# Patient Record
Sex: Female | Born: 1955 | Race: White | Hispanic: No | Marital: Married | Smoking: Never smoker
Health system: Southern US, Community
[De-identification: ages and names within clinical notes are randomized; demographics above are authoritative.]

## PROBLEM LIST (undated history)

## (undated) DIAGNOSIS — E079 Disorder of thyroid, unspecified: Secondary | ICD-10-CM

## (undated) DIAGNOSIS — D649 Anemia, unspecified: Secondary | ICD-10-CM

## (undated) DIAGNOSIS — T7840XA Allergy, unspecified, initial encounter: Secondary | ICD-10-CM

## (undated) DIAGNOSIS — M1990 Unspecified osteoarthritis, unspecified site: Secondary | ICD-10-CM

## (undated) DIAGNOSIS — E785 Hyperlipidemia, unspecified: Secondary | ICD-10-CM

## (undated) DIAGNOSIS — I959 Hypotension, unspecified: Secondary | ICD-10-CM

## (undated) HISTORY — PX: COLONOSCOPY: SHX174

## (undated) HISTORY — PX: WISDOM TOOTH EXTRACTION: SHX21

## (undated) HISTORY — PX: MEDIAL PARTIAL KNEE REPLACEMENT: SHX5965

## (undated) HISTORY — DX: Allergy, unspecified, initial encounter: T78.40XA

## (undated) HISTORY — DX: Disorder of thyroid, unspecified: E07.9

## (undated) HISTORY — DX: Anemia, unspecified: D64.9

## (undated) HISTORY — DX: Hypotension, unspecified: I95.9

## (undated) HISTORY — PX: BREAST BIOPSY: SHX20

## (undated) HISTORY — PX: KNEE ARTHROSCOPY: SUR90

## (undated) HISTORY — PX: TONSILLECTOMY: SUR1361

## (undated) HISTORY — PX: JOINT REPLACEMENT: SHX530

## (undated) HISTORY — DX: Hyperlipidemia, unspecified: E78.5

## (undated) HISTORY — DX: Unspecified osteoarthritis, unspecified site: M19.90

## (undated) HISTORY — PX: KNEE SURGERY: SHX244

---

## 2020-10-05 DIAGNOSIS — Z1211 Encounter for screening for malignant neoplasm of colon: Secondary | ICD-10-CM | POA: Diagnosis not present

## 2020-10-05 DIAGNOSIS — Z23 Encounter for immunization: Secondary | ICD-10-CM | POA: Diagnosis not present

## 2020-10-05 DIAGNOSIS — E039 Hypothyroidism, unspecified: Secondary | ICD-10-CM | POA: Diagnosis not present

## 2020-10-05 DIAGNOSIS — E785 Hyperlipidemia, unspecified: Secondary | ICD-10-CM | POA: Diagnosis not present

## 2020-10-05 DIAGNOSIS — Z1231 Encounter for screening mammogram for malignant neoplasm of breast: Secondary | ICD-10-CM | POA: Diagnosis not present

## 2020-10-05 DIAGNOSIS — Z0000 Encounter for general adult medical examination without abnormal findings: Secondary | ICD-10-CM | POA: Diagnosis not present

## 2020-10-10 DIAGNOSIS — Z0000 Encounter for general adult medical examination without abnormal findings: Secondary | ICD-10-CM | POA: Diagnosis not present

## 2020-10-10 DIAGNOSIS — E039 Hypothyroidism, unspecified: Secondary | ICD-10-CM | POA: Diagnosis not present

## 2020-10-25 DIAGNOSIS — Z1231 Encounter for screening mammogram for malignant neoplasm of breast: Secondary | ICD-10-CM | POA: Diagnosis not present

## 2020-11-03 ENCOUNTER — Encounter: Payer: Self-pay | Admitting: Gastroenterology

## 2020-11-14 DIAGNOSIS — D2372 Other benign neoplasm of skin of left lower limb, including hip: Secondary | ICD-10-CM | POA: Diagnosis not present

## 2020-11-14 DIAGNOSIS — D2271 Melanocytic nevi of right lower limb, including hip: Secondary | ICD-10-CM | POA: Diagnosis not present

## 2020-11-14 DIAGNOSIS — D239 Other benign neoplasm of skin, unspecified: Secondary | ICD-10-CM | POA: Diagnosis not present

## 2020-11-14 DIAGNOSIS — L578 Other skin changes due to chronic exposure to nonionizing radiation: Secondary | ICD-10-CM | POA: Diagnosis not present

## 2020-12-06 ENCOUNTER — Other Ambulatory Visit: Payer: Self-pay

## 2020-12-06 ENCOUNTER — Ambulatory Visit (AMBULATORY_SURGERY_CENTER): Payer: Self-pay | Admitting: *Deleted

## 2020-12-06 VITALS — Ht 67.0 in | Wt 163.0 lb

## 2020-12-06 DIAGNOSIS — Z1211 Encounter for screening for malignant neoplasm of colon: Secondary | ICD-10-CM

## 2020-12-06 NOTE — Progress Notes (Signed)
No egg or soy allergy known to patient  No issues with past sedation with any surgeries or procedures No intubation problems in the past  No FH of Malignant Hyperthermia No diet pills per patient No home 02 use per patient  No blood thinners per patient  Pt states issues with constipation due to thyroid- she goes daily but has hard balls- eats a high fiber diet - will add a 2 day Miralax prep   No A fib or A flutter  EMMI video to pt or via Ruth 19 guidelines implemented in PV today with Pt and RN  Pt is fully vaccinated  for Covid   Due to the COVID-19 pandemic we are asking patients to follow certain guidelines.  Pt aware of COVID protocols and [facility] guidelines

## 2020-12-19 ENCOUNTER — Encounter: Payer: Self-pay | Admitting: Gastroenterology

## 2020-12-23 ENCOUNTER — Encounter: Payer: Self-pay | Admitting: Gastroenterology

## 2020-12-23 ENCOUNTER — Other Ambulatory Visit: Payer: Self-pay

## 2020-12-23 ENCOUNTER — Ambulatory Visit (AMBULATORY_SURGERY_CENTER): Payer: BC Managed Care – PPO | Admitting: Gastroenterology

## 2020-12-23 VITALS — BP 91/85 | HR 64 | Temp 97.0°F | Resp 20 | Ht 67.0 in | Wt 163.0 lb

## 2020-12-23 DIAGNOSIS — Z1211 Encounter for screening for malignant neoplasm of colon: Secondary | ICD-10-CM

## 2020-12-23 MED ORDER — SODIUM CHLORIDE 0.9 % IV SOLN: 500.0000 mL | INTRAVENOUS | Status: DC

## 2020-12-23 NOTE — Op Note (Signed)
[facility] Patient Name: Tanya Brooks Procedure Date: 12/23/2020 8:28 AM MRN: [id number] Endoscopist: Justice Britain , MD Age: 65 Referring MD:  Date of Birth: 04/10/1956 Gender: Female Account #: [id number] Procedure:                Colonoscopy Indications:              Screening for colorectal malignant neoplasm Medicines:                Monitored Anesthesia Care Procedure:                Pre-Anesthesia Assessment:                           - Prior to the procedure, a History and Physical                            was performed, and patient medications and                            allergies were reviewed. The patient's tolerance of                            previous anesthesia was also reviewed. The risks                            and benefits of the procedure and the sedation                            options and risks were discussed with the patient.                            All questions were answered, and informed consent                            was obtained. Prior Anticoagulants: The patient has                            taken no previous anticoagulant or antiplatelet                            agents. ASA Grade Assessment: II - A patient with                            mild systemic disease. After reviewing the risks                            and benefits, the patient was deemed in                            satisfactory condition to undergo the procedure.                           After obtaining informed consent, the colonoscope  was passed under direct vision. Throughout the                            procedure, the patient's blood pressure, pulse, and                            oxygen saturations were monitored continuously. The                            Olympus PFC-[id number] (#[id number]) Colonoscope was                            introduced through the anus and advanced to the 5                            cm into the  ileum. The colonoscopy was performed                            without difficulty. The patient tolerated the                            procedure. The quality of the bowel preparation was                            adequate. Scope In: 8:48:04 AM Scope Out: 9:05:56 AM Scope Withdrawal Time: 0 hours 13 minutes 3 seconds  Total Procedure Duration: 0 hours 17 minutes 52 seconds  Findings:                 The digital rectal exam findings include                            hemorrhoids. Pertinent negatives include no                            palpable rectal lesions.                           The terminal ileum and ileocecal valve appeared                            normal.                           A few small-mouthed diverticula were found in the                            recto-sigmoid colon and sigmoid colon.                           Normal mucosa was found in the entire colon                            otherwise.  Non-bleeding non-thrombosed external and internal                            hemorrhoids were found during retroflexion, during                            perianal exam and during digital exam. The                            hemorrhoids were Grade II (internal hemorrhoids                            that prolapse but reduce spontaneously). Complications:            No immediate complications. Estimated Blood Loss:     Estimated blood loss: none. Impression:               - Hemorrhoids found on digital rectal exam.                           - The examined portion of the ileum was normal.                           - Diverticulosis in the recto-sigmoid colon and in                            the sigmoid colon.                           - Normal mucosa in the entire examined colon                            otherwise.                           - Non-bleeding non-thrombosed external and internal                            hemorrhoids. Recommendation:            - The patient will be observed post-procedure,                            until all discharge criteria are met.                           - Discharge patient to home.                           - Patient has a contact number available for                            emergencies. The signs and symptoms of potential                            delayed complications were discussed with the  patient. Return to normal activities tomorrow.                            Written discharge instructions were provided to the                            patient.                           - High fiber diet.                           - Use FiberCon 1-2 tablets PO daily.                           - Continue present medications.                           - Repeat colonoscopy in 10 years for screening                            purposes.                           - The findings and recommendations were discussed                            with the patient.                           - The findings and recommendations were discussed                            with the patient's family. Justice Britain, MD 12/23/2020 9:14:22 AM

## 2020-12-23 NOTE — Patient Instructions (Signed)
Impression/Recommendations:  Hemorrhoid, Diverticulosis, and High fiber diet handouts given to patient.  Use FiberCon 1-2 tablets by mouth daily. Continue present medications.  Repeat colonoscopy in 10 years for screening purposes.  YOU HAD AN ENDOSCOPIC PROCEDURE TODAY AT [facility]:   Refer to the procedure report that was given to you for any specific questions about what was found during the examination.  If the procedure report does not answer your questions, please call your gastroenterologist to clarify.  If you requested that your care partner not be given the details of your procedure findings, then the procedure report has been included in a sealed envelope for you to review at your convenience later.  YOU SHOULD EXPECT: Some feelings of bloating in the abdomen. Passage of more gas than usual.  Walking can help get rid of the air that was put into your GI tract during the procedure and reduce the bloating. If you had a lower endoscopy (such as a colonoscopy or flexible sigmoidoscopy) you may notice spotting of blood in your stool or on the toilet paper. If you underwent a bowel prep for your procedure, you may not have a normal bowel movement for a few days.  Please Note:  You might notice some irritation and congestion in your nose or some drainage.  This is from the oxygen used during your procedure.  There is no need for concern and it should clear up in a day or so.  SYMPTOMS TO REPORT IMMEDIATELY:   Following lower endoscopy (colonoscopy or flexible sigmoidoscopy):  Excessive amounts of blood in the stool  Significant tenderness or worsening of abdominal pains  Swelling of the abdomen that is new, acute  Fever of 100F or higher  For urgent or emergent issues, a gastroenterologist can be reached at any hour by calling [phone]. Do not use MyChart messaging for urgent concerns.    DIET:  We do recommend a small meal at first, but then you may proceed  to your regular diet.  Drink plenty of fluids but you should avoid alcoholic beverages for 24 hours.  ACTIVITY:  You should plan to take it easy for the rest of today and you should NOT DRIVE or use heavy machinery until tomorrow (because of the sedation medicines used during the test).    FOLLOW UP: Our staff will call the number listed on your records 48-72 hours following your procedure to check on you and address any questions or concerns that you may have regarding the information given to you following your procedure. If we do not reach you, we will leave a message.  We will attempt to reach you two times.  During this call, we will ask if you have developed any symptoms of COVID 19. If you develop any symptoms (ie: fever, flu-like symptoms, shortness of breath, cough etc.) before then, please call [phone].  If you test positive for Covid 19 in the 2 weeks post procedure, please call and report this information to [place].    If any biopsies were taken you will be contacted by phone or by letter within the next 1-3 weeks.  Please call us at [phone] if you have not heard about the biopsies in 3 weeks.    SIGNATURES/CONFIDENTIALITY: You and/or your care partner have signed paperwork which will be entered into your electronic medical record.  These signatures attest to the fact that that the information above on your After Visit Summary has been reviewed and is understood.  Full  responsibility of the confidentiality of this discharge information lies with you and/or your care-partner.

## 2020-12-23 NOTE — Progress Notes (Signed)
Report given to PACU, vss 

## 2020-12-27 ENCOUNTER — Telehealth: Payer: Self-pay | Admitting: *Deleted

## 2020-12-27 NOTE — Telephone Encounter (Signed)
  Follow up Call-  Call back number 12/23/2020  Post procedure Call Back phone  # [phone]  Permission to leave phone message Yes     Patient questions:  Do you have a fever, pain , or abdominal swelling? No. Pain Score  0 *  Have you tolerated food without any problems? Yes.    Have you been able to return to your normal activities? Yes.    Do you have any questions about your discharge instructions: Diet   No. Medications  No. Follow up visit  No.  Do you have questions or concerns about your Care? No.  Actions: * If pain score is 4 or above: No action needed, pain <4.  1. Have you developed a fever since your procedure? no  2.   Have you had an respiratory symptoms (SOB or cough) since your procedure? no  3.   Have you tested positive for COVID 19 since your procedure no  4.   Have you had any family members/close contacts diagnosed with the COVID 19 since your procedure?  no   If yes to any of these questions please route to Joylene John, RN and Joella Prince, RN

## 2021-01-19 DIAGNOSIS — K5790 Diverticulosis of intestine, part unspecified, without perforation or abscess without bleeding: Secondary | ICD-10-CM | POA: Insufficient documentation

## 2021-05-26 ENCOUNTER — Ambulatory Visit (INDEPENDENT_AMBULATORY_CARE_PROVIDER_SITE_OTHER): Payer: BC Managed Care – PPO | Admitting: Family Medicine

## 2021-05-26 ENCOUNTER — Other Ambulatory Visit: Payer: Self-pay

## 2021-05-26 ENCOUNTER — Encounter: Payer: Self-pay | Admitting: Family Medicine

## 2021-05-26 VITALS — BP 100/78 | HR 65 | Temp 97.7°F | Ht 67.0 in | Wt 157.2 lb

## 2021-05-26 DIAGNOSIS — M1611 Unilateral primary osteoarthritis, right hip: Secondary | ICD-10-CM

## 2021-05-26 NOTE — Progress Notes (Signed)
Tanya Brooks is a 65 y.o. female  Chief Complaint  Patient presents with   Establish Care    Np here to discuss hx of ongoing Rt hip pain,  pt was going to have a hip replacement 2 yrs ago but it didn't happen. Pt takes Tylenol or Aleve for it.      HPI: Tanya Brooks is a 65 y.o. female patient seen today to establish care with our office. Her previous PCP was with [facility]. Moved to [place] from [place] to be closer to children and grandchildren.  Pt complains of chronic Rt hip pain x years.  Hip replacement was discussed 2 years ago but it was cancelled d/t covid (elective surgery). Pain is pretty manageable during the day, but worse at night. Significant difficulty sleeping d/t pain. She has not seen ortho in [place].    Past Medical History:  Diagnosis Date   Allergy    Anemia    past hx of anemia   Arthritis    OA   Hyperlipidemia    Hypotension    low normal    Thyroid disease     Past Surgical History:  Procedure Laterality Date   COLONOSCOPY     age 41- normal per pt    KNEE ARTHROSCOPY Left    KNEE SURGERY     1970's    MEDIAL PARTIAL KNEE REPLACEMENT Left    TONSILLECTOMY     WISDOM TOOTH EXTRACTION      Social History   Socioeconomic History   Marital status: Married    Spouse name: Not on file   Number of children: Not on file   Years of education: Not on file   Highest education level: Not on file  Occupational History   Not on file  Tobacco Use   Smoking status: Never   Smokeless tobacco: Never  Substance and Sexual Activity   Alcohol use: Not Currently   Drug use: Never   Sexual activity: Not on file  Other Topics Concern   Not on file  Social History Narrative   Not on file   Social Determinants of Health   Financial Resource Strain: Not on file  Food Insecurity: Not on file  Transportation Needs: Not on file  Physical Activity: Not on file  Stress: Not on file  Social Connections: Not on file  Intimate Partner Violence: Not on file     Family History  Problem Relation Age of Onset   Colon cancer Neg Hx    Colon polyps Neg Hx    Esophageal cancer Neg Hx    Rectal cancer Neg Hx    Stomach cancer Neg Hx       There is no immunization history on file for this patient.  Outpatient Encounter Medications as of 05/26/2021  Medication Sig   atorvastatin (LIPITOR) 10 MG tablet Take 10 mg by mouth daily.   Calcium Carb-Cholecalciferol (CALCIUM 600 + D PO) Take by mouth.   LUTEIN PO Take by mouth.   Multiple Vitamins-Minerals (WOMENS 50+ ADVANCED) CAPS Take by mouth.   Omega-3 Fatty Acids (FISH OIL) 1200 MG CAPS Take 1 capsule by mouth daily.   SYNTHROID 75 MCG tablet Take 75 mcg by mouth daily.   Turmeric 500 MG CAPS Take by mouth.   VITAMIN D PO Take by mouth.   No facility-administered encounter medications on file as of 05/26/2021.     ROS: Pertinent positives and negatives noted in HPI. Remainder of ROS non-contributory    No  Known Allergies  BP 100/78 (BP Location: Left Arm, Patient Position: Sitting, Cuff Size: Normal)   Pulse 65   Temp 97.7 F (36.5 C) (Temporal)   Ht 5\' 7"  (1.702 m)   Wt 157 lb 3.2 oz (71.3 kg)   SpO2 98%   BMI 24.62 kg/m  Wt Readings from Last 3 Encounters:  05/26/21 157 lb 3.2 oz (71.3 kg)  12/23/20 163 lb (73.9 kg)  12/06/20 163 lb (73.9 kg)   Temp Readings from Last 3 Encounters:  05/26/21 97.7 F (36.5 C) (Temporal)  12/23/20 (!) 97 F (36.1 C)   BP Readings from Last 3 Encounters:  05/26/21 100/78  12/23/20 91/85   Pulse Readings from Last 3 Encounters:  05/26/21 65  12/23/20 64     Physical Exam Constitutional:      General: She is not in acute distress.    Appearance: Normal appearance. She is not ill-appearing.  Musculoskeletal:     Right hip: Bony tenderness present. Decreased range of motion.  Neurological:     Mental Status: She is alert and oriented to person, place, and time.     A/P:  1. 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  Safety protocols were in place, including screening questions prior to the visit, additional usage of staff PPE, and extensive cleaning of exam room while observing appropriate contact time as indicated for disinfecting solutions.

## 2021-07-12 DIAGNOSIS — M1611 Unilateral primary osteoarthritis, right hip: Secondary | ICD-10-CM | POA: Diagnosis not present

## 2021-07-19 ENCOUNTER — Other Ambulatory Visit: Payer: BC Managed Care – PPO

## 2021-07-19 ENCOUNTER — Encounter: Payer: Self-pay | Admitting: Family Medicine

## 2021-07-19 ENCOUNTER — Other Ambulatory Visit: Payer: Self-pay

## 2021-07-19 ENCOUNTER — Ambulatory Visit (INDEPENDENT_AMBULATORY_CARE_PROVIDER_SITE_OTHER): Payer: BC Managed Care – PPO | Admitting: Family Medicine

## 2021-07-19 VITALS — BP 106/68 | HR 61 | Temp 97.4°F | Ht 67.0 in | Wt 157.6 lb

## 2021-07-19 DIAGNOSIS — H6123 Impacted cerumen, bilateral: Secondary | ICD-10-CM

## 2021-07-19 DIAGNOSIS — Z0000 Encounter for general adult medical examination without abnormal findings: Secondary | ICD-10-CM

## 2021-07-19 DIAGNOSIS — I6529 Occlusion and stenosis of unspecified carotid artery: Secondary | ICD-10-CM | POA: Diagnosis not present

## 2021-07-19 DIAGNOSIS — E039 Hypothyroidism, unspecified: Secondary | ICD-10-CM | POA: Insufficient documentation

## 2021-07-19 DIAGNOSIS — M1611 Unilateral primary osteoarthritis, right hip: Secondary | ICD-10-CM | POA: Insufficient documentation

## 2021-07-19 DIAGNOSIS — Z01818 Encounter for other preprocedural examination: Secondary | ICD-10-CM | POA: Diagnosis not present

## 2021-07-19 HISTORY — DX: Encounter for general adult medical examination without abnormal findings: Z00.00

## 2021-07-19 LAB — COMPREHENSIVE METABOLIC PANEL
ALT: 15 U/L (ref 0–35)
AST: 17 U/L (ref 0–37)
Albumin: 4.3 g/dL (ref 3.5–5.2)
Alkaline Phosphatase: 75 U/L (ref 39–117)
BUN: 11 mg/dL (ref 6–23)
CO2: 26 mEq/L (ref 19–32)
Calcium: 9.3 mg/dL (ref 8.4–10.5)
Chloride: 104 mEq/L (ref 96–112)
Creatinine, Ser: 0.66 mg/dL (ref 0.40–1.20)
GFR: 93.77 mL/min (ref >60.00)
Glucose, Bld: 70 mg/dL (ref 70–99)
Potassium: 3.7 mEq/L (ref 3.5–5.1)
Sodium: 140 mEq/L (ref 135–145)
Total Bilirubin: 1.4 mg/dL — ABNORMAL HIGH (ref 0.2–1.2)
Total Protein: 6.9 g/dL (ref 6.0–8.3)

## 2021-07-19 LAB — PROTIME-INR
INR: 1 ratio (ref 0.8–1.0)
Prothrombin Time: 11.2 s (ref 9.6–13.1)

## 2021-07-19 LAB — LIPID PANEL
Cholesterol: 152 mg/dL (ref 0–200)
HDL: 52.3 mg/dL (ref >39.00)
LDL Cholesterol: 78 mg/dL (ref 0–99)
NonHDL: 99.7
Total CHOL/HDL Ratio: 3
Triglycerides: 108 mg/dL (ref 0.0–149.0)
VLDL: 21.6 mg/dL (ref 0.0–40.0)

## 2021-07-19 LAB — CBC
HCT: 41.5 % (ref 36.0–46.0)
Hemoglobin: 13.8 g/dL (ref 12.0–15.0)
MCHC: 33.3 g/dL (ref 30.0–36.0)
MCV: 92.3 fl (ref 78.0–100.0)
Platelets: 219 10*3/uL (ref 150.0–400.0)
RBC: 4.5 Mil/uL (ref 3.87–5.11)
RDW: 13.1 % (ref 11.5–15.5)
WBC: 5.3 10*3/uL (ref 4.0–10.5)

## 2021-07-19 LAB — TSH: TSH: 1.1 u[IU]/mL (ref 0.35–5.50)

## 2021-07-19 NOTE — Progress Notes (Signed)
Established Patient Office Visit  Subjective:  Patient ID: Tanya Brooks, female    DOB: 09-Nov-1956  Age: 65 y.o. MRN: [id number]  CC:  Chief Complaint  Patient presents with   Medical Clearance    Dr. Bryan Lemma patient here for medical clearance. No concerns patient fasting.     HPI Tanya Brooks presents for follow-up of hypothyroidism, carotid artery diseas2 and severe osteoarthritis left hip.  She has taken low-dose Lipitor for years for prophylaxis.  She has had no problems taking this medication.  She has been on Synthroid 75 mcg for years as well and this is been a stable dose for her.  Carotid ultrasounds 5 years ago.  Scheduled for hip surgery 2 years ago but was canceled secondary to the pandemic.  She stays active athletically by swimming.  Past Medical History:  Diagnosis Date   Allergy    Anemia    past hx of anemia   Arthritis    OA   Hyperlipidemia    Hypotension    low normal    Thyroid disease     Past Surgical History:  Procedure Laterality Date   COLONOSCOPY     age 78- normal per pt    KNEE ARTHROSCOPY Left    KNEE SURGERY     1970's    MEDIAL PARTIAL KNEE REPLACEMENT Left    TONSILLECTOMY     WISDOM TOOTH EXTRACTION      Family History  Problem Relation Age of Onset   Colon cancer Neg Hx    Colon polyps Neg Hx    Esophageal cancer Neg Hx    Rectal cancer Neg Hx    Stomach cancer Neg Hx     Social History   Socioeconomic History   Marital status: Married    Spouse name: Not on file   Number of children: Not on file   Years of education: Not on file   Highest education level: Not on file  Occupational History   Not on file  Tobacco Use   Smoking status: Never   Smokeless tobacco: Never  Substance and Sexual Activity   Alcohol use: Not Currently   Drug use: Never   Sexual activity: Not on file  Other Topics Concern   Not on file  Social History Narrative   Not on file   Social Determinants of Health   Financial Resource  Strain: Not on file  Food Insecurity: Not on file  Transportation Needs: Not on file  Physical Activity: Not on file  Stress: Not on file  Social Connections: Not on file  Intimate Partner Violence: Not on file    Outpatient Medications Prior to Visit  Medication Sig Dispense Refill   atorvastatin (LIPITOR) 10 MG tablet Take 10 mg by mouth daily.     Calcium Carb-Cholecalciferol (CALCIUM 600 + D PO) Take by mouth.     LUTEIN PO Take by mouth.     Multiple Vitamins-Minerals (WOMENS 50+ ADVANCED) CAPS Take by mouth.     Omega-3 Fatty Acids (FISH OIL) 1200 MG CAPS Take 1 capsule by mouth daily.     SYNTHROID 75 MCG tablet Take 75 mcg by mouth daily.     Turmeric 500 MG CAPS Take by mouth.     VITAMIN D PO Take by mouth.     No facility-administered medications prior to visit.    No Known Allergies  ROS Review of Systems  Constitutional:  Negative for chills, diaphoresis, fatigue, fever and unexpected weight change.  HENT:  Negative.    Eyes:  Negative for photophobia and visual disturbance.  Respiratory: Negative.    Cardiovascular: Negative.   Gastrointestinal: Negative.   Endocrine: Negative for cold intolerance, heat intolerance, polyphagia and polyuria.  Genitourinary: Negative.   Musculoskeletal:  Positive for arthralgias and gait problem.  Neurological:  Negative for speech difficulty and weakness.  Psychiatric/Behavioral: Negative.       Objective:    Physical Exam Vitals and nursing note reviewed.  Constitutional:      General: She is not in acute distress.    Appearance: Normal appearance. She is normal weight. She is not ill-appearing, toxic-appearing or diaphoretic.  HENT:     Head: Normocephalic and atraumatic.     Right Ear: Tympanic membrane, ear canal and external ear normal.     Left Ear: Tympanic membrane, ear canal and external ear normal.     Mouth/Throat:     Mouth: Mucous membranes are moist.     Pharynx: Oropharynx is clear. No oropharyngeal  exudate or posterior oropharyngeal erythema.  Eyes:     Extraocular Movements: Extraocular movements intact.     Conjunctiva/sclera: Conjunctivae normal.     Pupils: Pupils are equal, round, and reactive to light.  Neck:     Vascular: No carotid bruit.  Cardiovascular:     Rate and Rhythm: Normal rate and regular rhythm.  Pulmonary:     Effort: Pulmonary effort is normal.     Breath sounds: Normal breath sounds.  Abdominal:     General: Bowel sounds are normal.  Musculoskeletal:     Cervical back: No rigidity or tenderness.     Right hip: Tenderness present. Decreased range of motion.     Left hip: No tenderness. Normal range of motion.     Right lower leg: No edema.     Left lower leg: No edema.  Lymphadenopathy:     Cervical: No cervical adenopathy.  Skin:    General: Skin is warm and dry.  Neurological:     Mental Status: She is alert and oriented to person, place, and time.  Psychiatric:        Mood and Affect: Mood normal.        Behavior: Behavior normal.    BP 106/68 (BP Location: Right Arm, Patient Position: Sitting, Cuff Size: Normal)   Pulse 61   Temp (!) 97.4 F (36.3 C) (Temporal)   Ht $R'5\' 7"'Uv$  (1.702 m)   Wt 157 lb 9.6 oz (71.5 kg)   SpO2 97%   BMI 24.68 kg/m  Wt Readings from Last 3 Encounters:  07/19/21 157 lb 9.6 oz (71.5 kg)  05/26/21 157 lb 3.2 oz (71.3 kg)  12/23/20 163 lb (73.9 kg)     Health Maintenance Due  Topic Date Due   Pneumococcal Vaccine 71-55 Years old (1 - PCV) Never done   HIV Screening  Never done   Hepatitis C Screening  Never done   TETANUS/TDAP  Never done   Zoster Vaccines- Shingrix (1 of 2) Never done   PAP SMEAR-Modifier  Never done   MAMMOGRAM  Never done   INFLUENZA VACCINE  07/17/2021    There are no preventive care reminders to display for this patient.  No results found for: TSH No results found for: WBC, HGB, HCT, MCV, PLT No results found for: NA, K, CHLORIDE, CO2, GLUCOSE, BUN, CREATININE, BILITOT, ALKPHOS, AST,  ALT, PROT, ALBUMIN, CALCIUM, ANIONGAP, EGFR, GFR No results found for: CHOL No results found for: HDL No results found for:  [place] No results found for: TRIG No results found for: CHOLHDL No results found for: HGBA1C    Assessment & Plan:   Problem List Items Addressed This Visit       Cardiovascular and Mediastinum   Carotid artery calcification   Relevant Orders   Comprehensive metabolic panel   Lipid panel   VAS US CAROTID     Endocrine   Hypothyroidism   Relevant Orders   TSH     Nervous and Auditory   Excessive cerumen in both ear canals     Musculoskeletal and Integument   Primary osteoarthritis of right hip     Other   Healthcare maintenance - Primary   Relevant Orders   CBC   Lipid panel   Urinalysis, Routine w reflex microscopic    No orders of the defined types were placed in this encounter.   Follow-up: Return in about 6 months (around 01/19/2022), or if symptoms worsen or fail to improve.  Fasting lab work today.  She will use Debrox drops in her ears and return for irrigation as needed.  Lab work is pending regarding cholesterol and hypothyroidism.  Medications will be adjusted as needed.  Follow-up carotid ultrasound after her hip surgery.  She is cleared for surgery.  Libby Maw, MD

## 2021-07-19 NOTE — Addendum Note (Signed)
Addended by: Beryle Lathe S on: 07/19/2021 01:31 PM   Modules accepted: Orders

## 2021-07-20 ENCOUNTER — Other Ambulatory Visit: Payer: Self-pay

## 2021-07-20 ENCOUNTER — Other Ambulatory Visit: Payer: BC Managed Care – PPO

## 2021-07-28 ENCOUNTER — Telehealth: Payer: Self-pay

## 2021-07-28 NOTE — Telephone Encounter (Signed)
Spoke to patient. Patient confirm no further paperwork needed. Sw., cma

## 2021-08-01 ENCOUNTER — Encounter (HOSPITAL_COMMUNITY): Payer: BC Managed Care – PPO

## 2021-08-01 ENCOUNTER — Ambulatory Visit (HOSPITAL_COMMUNITY): Payer: BC Managed Care – PPO

## 2021-08-07 DIAGNOSIS — M1611 Unilateral primary osteoarthritis, right hip: Secondary | ICD-10-CM | POA: Diagnosis not present

## 2021-08-14 ENCOUNTER — Telehealth: Payer: Self-pay | Admitting: Family Medicine

## 2021-08-14 NOTE — Telephone Encounter (Signed)
I called patient's husband, Elta Guadeloupe, to reschedule his appointment for [facility].  Elta Guadeloupe wanted to let Dr.Kremer know that patient did very well with her hip surgery and she's at home.

## 2021-09-25 ENCOUNTER — Encounter (HOSPITAL_COMMUNITY): Payer: Self-pay

## 2021-09-25 ENCOUNTER — Ambulatory Visit (HOSPITAL_COMMUNITY): Payer: BC Managed Care – PPO

## 2021-10-20 ENCOUNTER — Other Ambulatory Visit: Payer: Self-pay

## 2021-10-20 ENCOUNTER — Ambulatory Visit (INDEPENDENT_AMBULATORY_CARE_PROVIDER_SITE_OTHER): Payer: BC Managed Care – PPO | Admitting: Nurse Practitioner

## 2021-10-20 ENCOUNTER — Telehealth: Payer: Self-pay | Admitting: Nurse Practitioner

## 2021-10-20 VITALS — BP 98/60 | HR 76 | Temp 97.0°F | Ht 67.0 in | Wt 158.4 lb

## 2021-10-20 DIAGNOSIS — Z23 Encounter for immunization: Secondary | ICD-10-CM

## 2021-10-20 DIAGNOSIS — D1801 Hemangioma of skin and subcutaneous tissue: Secondary | ICD-10-CM | POA: Insufficient documentation

## 2021-10-20 DIAGNOSIS — D2370 Other benign neoplasm of skin of unspecified lower limb, including hip: Secondary | ICD-10-CM | POA: Insufficient documentation

## 2021-10-20 DIAGNOSIS — L821 Other seborrheic keratosis: Secondary | ICD-10-CM | POA: Insufficient documentation

## 2021-10-20 DIAGNOSIS — Z7689 Persons encountering health services in other specified circumstances: Secondary | ICD-10-CM

## 2021-10-20 DIAGNOSIS — D239 Other benign neoplasm of skin, unspecified: Secondary | ICD-10-CM | POA: Insufficient documentation

## 2021-10-20 DIAGNOSIS — Z1231 Encounter for screening mammogram for malignant neoplasm of breast: Secondary | ICD-10-CM

## 2021-10-20 NOTE — Patient Instructions (Addendum)
Schedule appt with [facility]: [address], [place], [place] [address]. Phone: [phone] Or  [facility]: [address] [place] [address] P: [phone].  You will be contacted to schedule appt for mammogram at [facility]. Your last mammogram was done by [facility].

## 2021-10-20 NOTE — Progress Notes (Signed)
Subjective:  Patient ID: Tanya Brooks, female    DOB: 1956-11-19  Age: 65 y.o. MRN: [id number]  CC: Establish Care (Referral for Mammogram needed/Vaccines given today (Tdap, Pneumococal 20, and Shingrix).)  HPI Transfer care from Dr. Bryan Lemma She had annual physical exam with labs completed 07/19/2021 by Dr. Ethelene Hal. States she is more comfortable with female provider to perform breast and pelvic exam. Wants referral for mammogram. Last mammogram completed by Fidela Salisbury. Also requesting for breast and pelvic exam today. Denies any acute complaint.  Reviewed past Medical, Social and Family history today.  Outpatient Medications Prior to Visit  Medication Sig Dispense Refill   atorvastatin (LIPITOR) 10 MG tablet Take 10 mg by mouth daily.     Calcium Carb-Cholecalciferol (CALCIUM 600 + D PO) Take by mouth.     LUTEIN PO Take by mouth.     Multiple Vitamins-Minerals (WOMENS 50+ ADVANCED) CAPS Take by mouth.     Omega-3 Fatty Acids (FISH OIL) 1200 MG CAPS Take 1 capsule by mouth daily.     SYNTHROID 75 MCG tablet Take 75 mcg by mouth daily.     Turmeric 500 MG CAPS Take by mouth.     VITAMIN D PO Take by mouth.     No facility-administered medications prior to visit.    ROS See HPI  Objective:  BP 98/60 (BP Location: Left Arm, Patient Position: Sitting, Cuff Size: Normal)   Pulse 76   Temp (!) 97 F (36.1 C) (Temporal)   Ht 5\' 7"  (1.702 m)   Wt 158 lb 6.4 oz (71.8 kg)   SpO2 98%   BMI 24.81 kg/m   Physical Exam Vitals reviewed.  Chest:  Breasts:    Breasts are symmetrical.     Right: Normal.     Left: Normal.  Lymphadenopathy:     Upper Body:     Right upper body: No supraclavicular, axillary or pectoral adenopathy.     Left upper body: No supraclavicular, axillary or pectoral adenopathy.  Neurological:     Mental Status: She is alert.    Assessment & Plan:  This visit occurred during the SARS-CoV-2 public health emergency.  Safety protocols were in place,  including screening questions prior to the visit, additional usage of staff PPE, and extensive cleaning of exam room while observing appropriate contact time as indicated for disinfecting solutions.   Tanya Brooks was seen today for establish care.  Diagnoses and all orders for this visit:  Establishing care with new doctor, encounter for  Breast cancer screening by mammogram -     MM 3D SCREEN BREAST BILATERAL; Future  Need for pneumococcal vaccine -     Pneumococcal conjugate vaccine 20-valent (Prevnar 20)  Need for diphtheria-tetanus-pertussis (Tdap) vaccine -     Tdap vaccine greater than or equal to 7yo IM  Need for shingles vaccine -     Varicella-zoster vaccine IM  Informed Tanya Brooks that an office visit for cervical cancer screen separate from annual complete exam will not be covered by her insurance. She verbalized understanding and decided to schedule an appt with GYN. Provided list of GYN offices.  Problem List Items Addressed This Visit   None Visit Diagnoses     Establishing care with new doctor, encounter for    -  Primary   Breast cancer screening by mammogram       Relevant Orders   MM 3D SCREEN BREAST BILATERAL   Need for pneumococcal vaccine       Relevant Orders  Pneumococcal conjugate vaccine 20-valent (Prevnar 20) (Completed)   Need for diphtheria-tetanus-pertussis (Tdap) vaccine       Relevant Orders   Tdap vaccine greater than or equal to 7yo IM (Completed)   Need for shingles vaccine       Relevant Orders   Varicella-zoster vaccine IM (Completed)       Follow-up: Return in about 9 months (around 07/19/2022) for CPE(fasting).  Wilfred Lacy, NP

## 2021-10-20 NOTE — Telephone Encounter (Signed)
Pt is confused on why she could not have her pap done at this appointment today. [employer] told her it would be a covered procedure outside of an annual Physical. Baldo Ash informed her that a pap would not be covered outside of an annual physical at a Primary Care office. She would have to find an OBGYN for that.   Pt and husband called and are very concerned and would like more explanation.   Please advise... and if a Supervisor could give them a call back.Marland KitchenMarland KitchenMarland Kitchen

## 2021-10-22 ENCOUNTER — Encounter: Payer: Self-pay | Admitting: Nurse Practitioner

## 2021-10-23 NOTE — Telephone Encounter (Signed)
[place] saw pt was in 11/4 for TOC/CPE. I am happy to call her back. Please advise me on below.

## 2021-10-23 NOTE — Addendum Note (Signed)
Addended by: Leana Gamer on: 10/23/2021 09:47 AM   Modules accepted: Level of Service

## 2021-10-23 NOTE — Telephone Encounter (Signed)
Called pt and explained all below. Pt understood. We have scheduled cpe w/pap 12/15/21

## 2021-12-05 ENCOUNTER — Encounter: Payer: Self-pay | Admitting: Nurse Practitioner

## 2021-12-05 ENCOUNTER — Other Ambulatory Visit: Payer: Self-pay

## 2021-12-05 ENCOUNTER — Ambulatory Visit (INDEPENDENT_AMBULATORY_CARE_PROVIDER_SITE_OTHER): Payer: BC Managed Care – PPO | Admitting: Nurse Practitioner

## 2021-12-05 ENCOUNTER — Other Ambulatory Visit (HOSPITAL_COMMUNITY)
Admission: RE | Admit: 2021-12-05 | Discharge: 2021-12-05 | Disposition: A | Discharge status: Home / Self Care | Payer: BC Managed Care – PPO | Source: Ambulatory Visit | Attending: Nurse Practitioner | Admitting: Nurse Practitioner

## 2021-12-05 VITALS — BP 100/60 | HR 78 | Temp 97.7°F | Ht 66.0 in | Wt 157.4 lb

## 2021-12-05 DIAGNOSIS — Z9889 Other specified postprocedural states: Secondary | ICD-10-CM | POA: Insufficient documentation

## 2021-12-05 DIAGNOSIS — Z124 Encounter for screening for malignant neoplasm of cervix: Secondary | ICD-10-CM | POA: Diagnosis not present

## 2021-12-05 DIAGNOSIS — Z0000 Encounter for general adult medical examination without abnormal findings: Secondary | ICD-10-CM | POA: Diagnosis not present

## 2021-12-05 DIAGNOSIS — Z96641 Presence of right artificial hip joint: Secondary | ICD-10-CM | POA: Insufficient documentation

## 2021-12-05 NOTE — Progress Notes (Signed)
Subjective:    Patient ID: Tanya Brooks, female    DOB: [birth year], 66 y.o.   MRN: [id number]  Patient presents today for CPE  HPI  Vision: will schedule Dental:up to date Diet:heart healthy Exercise:walking and swimming Weight:  Wt Readings from Last 3 Encounters:  12/05/21 157 lb 6.4 oz (71.4 kg)  10/20/21 158 lb 6.4 oz (71.8 kg)  07/19/21 157 lb 9.6 oz (71.5 kg)    Sexual History (orientation,birth control, marital status, STD):PAP needed today, up to date with breast exam, upcoming appt for mammogram, up to date with colonoscopy  Depression/Suicide: Depression screen [facility] 2/9 12/05/2021 10/20/2021 07/19/2021 05/26/2021  Decreased Interest 0 0 0 0  Down, Depressed, Hopeless 0 0 0 0  PHQ - 2 Score 0 0 0 0  Altered sleeping 1 0 - -  Tired, decreased energy 1 1 - -  Change in appetite 0 0 - -  Feeling bad or failure about yourself  0 0 - -  Trouble concentrating 0 0 - -  Moving slowly or fidgety/restless 0 0 - -  Suicidal thoughts 0 0 - -  PHQ-9 Score 2 1 - -  Difficult doing work/chores Not difficult at all Not difficult at all - -   Immunizations: (TDAP, Hep C screen, Pneumovax, Influenza, zoster)  Health Maintenance  Topic Date Due   Pap Smear  Never done   Mammogram  Never done   Hepatitis C Screening: USPSTF Recommendation to screen - Ages 18-79 yo.  12/05/2022*   HIV Screening  12/05/2022*   Zoster (Shingles) Vaccine (2 of 2) 12/15/2021   Colon Cancer Screening  12/23/2030   Tetanus Vaccine  10/21/2031   Pneumococcal Vaccination  Completed   Flu Shot  Completed   COVID-19 Vaccine  Completed   HPV Vaccine  Aged Out  *Topic was postponed. The date shown is not the original due date.   Fall Risk: Fall Risk  12/05/2021 10/20/2021 07/19/2021  Falls in the past year? 0 0 0  Number falls in past yr: 0 0 -  Injury with Fall? 0 0 -  Risk for fall due to : No Fall Risks No Fall Risks -  Follow up Falls evaluation completed Falls evaluation completed -   Medications  and allergies reviewed with patient and updated if appropriate.  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     Calcium Carb-Cholecalciferol (CALCIUM 600 + D PO) Take by mouth.     LUTEIN PO Take by mouth.     Multiple Vitamins-Minerals (WOMENS 50+ ADVANCED) CAPS Take by mouth.     Omega-3 Fatty Acids (FISH OIL) 1200 MG CAPS Take 1 capsule by mouth daily.     SYNTHROID 75 MCG tablet Take 75 mcg by mouth daily.     Turmeric 500 MG CAPS Take by mouth.     VITAMIN D PO Take by mouth.     No  current facility-administered medications on file prior to visit.    Past Medical History:  Diagnosis Date   Allergy    Anemia    past hx of anemia   Arthritis    OA   Hyperlipidemia    Hypotension    low normal    Thyroid disease     Past Surgical History:  Procedure Laterality Date   COLONOSCOPY     age 70- normal per pt    JOINT REPLACEMENT     Partial KneeReplacement- June 2012, Hip Replacement Aug.2022   KNEE ARTHROSCOPY Left    KNEE SURGERY     1970's    MEDIAL PARTIAL KNEE REPLACEMENT Left    TONSILLECTOMY     WISDOM TOOTH EXTRACTION      Social History   Socioeconomic History   Marital status: Married    Spouse name: Not on file   Number of children: Not on file   Years of education: Not on file   Highest education level: Not on file  Occupational History   Not on file   Tobacco Use   Smoking status: Never   Smokeless tobacco: Never  Vaping Use   Vaping Use: Never used  Substance and Sexual Activity   Alcohol use: Never   Drug use: Never   Sexual activity: Yes    Birth control/protection: Post-menopausal  Other Topics Concern   Not on file  Social History Narrative   Not on file   Social Determinants of Health   Financial Resource Strain: Not on file  Food Insecurity: Not on file  Transportation Needs: Not on file  Physical Activity: Not on file  Stress: Not on file  Social Connections: Not on file    Family History  Problem Relation Age of Onset   Cancer Mother    Cancer Father    Colon cancer Neg Hx    Colon polyps Neg Hx    Esophageal cancer Neg Hx    Rectal cancer Neg Hx    Stomach cancer Neg Hx         ROS  Objective:   Vitals:   12/05/21 1541  BP: 100/60  Pulse: 78  Temp: 97.7 F (36.5 C)  SpO2: 98%    Body mass index is 25.41 kg/m.   Physical Examination:  Physical Exam Vitals reviewed. Exam conducted with a chaperone present.  Constitutional:      General: She is not in acute distress.    Appearance: She is well-developed.  HENT:     Right Ear: Tympanic membrane, ear canal and external ear normal.     Left Ear: Tympanic membrane, ear canal and external ear normal.     Mouth/Throat:     Pharynx: No oropharyngeal exudate.  Eyes:     Conjunctiva/sclera: Conjunctivae normal.     Pupils: Pupils are equal, round, and reactive to light.  Cardiovascular:     Rate and Rhythm: Normal rate and regular rhythm.     Pulses: Normal pulses.     Heart sounds: Normal heart sounds.  Pulmonary:     Effort: Pulmonary effort is normal. No respiratory distress.     Breath sounds: Normal breath sounds.  Chest:     Chest wall: No tenderness.  Abdominal:     General: Bowel sounds are normal.     Palpations: Abdomen is soft.     Hernia: There is no hernia in the left inguinal area or right inguinal area.   Genitourinary:    General: Normal  vulva.     Exam position: Lithotomy position.     Labia:        Right: No rash or tenderness.        Left: No rash or tenderness.      Vagina: Normal.     Cervix: Normal.     Uterus: With uterine prolapse. Not deviated, not enlarged, not fixed and not tender.      Adnexa: Right adnexa normal and left adnexa normal.  Musculoskeletal:        General: Normal range of motion.     Right lower leg: No edema.     Left lower leg: No edema.  Lymphadenopathy:     Lower Body: No right inguinal adenopathy. No left inguinal adenopathy.  Skin:    General: Skin is warm and dry.  Neurological:     Mental Status: She is alert and oriented to person, place, and time.     Deep Tendon Reflexes: Reflexes are normal and symmetric.  Psychiatric:        Mood and Affect: Mood normal.        Behavior: Behavior normal.   ASSESSMENT and PLAN: This visit occurred during the SARS-CoV-2 public health emergency.  Safety protocols were in place, including screening questions prior to the visit, additional usage of staff PPE, and extensive cleaning of exam room while observing appropriate contact time as indicated for disinfecting solutions.   Tanya Brooks was seen today for annual exam.  Diagnoses and all orders for this visit:  Preventative health care -     Cytology - PAP  Encounter for Papanicolaou smear for cervical cancer screening -     Cytology - PAP  Labs completed 07/2021: normal    Problem List Items Addressed This Visit   None Visit Diagnoses     Preventative health care    -  Primary   Relevant Orders   Cytology - PAP   Encounter for Papanicolaou smear for cervical cancer screening       Relevant Orders   Cytology - PAP       Follow up: Return in about 1 year (around 12/05/2022) for CPE (fasting).  Wilfred Lacy, NP

## 2021-12-05 NOTE — Patient Instructions (Signed)

## 2021-12-12 ENCOUNTER — Other Ambulatory Visit: Payer: Self-pay

## 2021-12-12 ENCOUNTER — Ambulatory Visit
Admission: RE | Admit: 2021-12-12 | Discharge: 2021-12-12 | Disposition: A | Discharge status: Home / Self Care | Payer: BC Managed Care – PPO | Source: Ambulatory Visit | Attending: Nurse Practitioner | Admitting: Nurse Practitioner

## 2021-12-12 DIAGNOSIS — Z1231 Encounter for screening mammogram for malignant neoplasm of breast: Secondary | ICD-10-CM | POA: Diagnosis not present

## 2021-12-12 LAB — CYTOLOGY - PAP
Comment: NEGATIVE
Diagnosis: NEGATIVE
High risk HPV: NEGATIVE

## 2021-12-15 ENCOUNTER — Encounter: Payer: BC Managed Care – PPO | Admitting: Nurse Practitioner

## 2021-12-20 ENCOUNTER — Telehealth: Payer: Self-pay | Admitting: Nurse Practitioner

## 2021-12-20 DIAGNOSIS — E782 Mixed hyperlipidemia: Secondary | ICD-10-CM

## 2021-12-20 DIAGNOSIS — E039 Hypothyroidism, unspecified: Secondary | ICD-10-CM

## 2021-12-20 NOTE — Telephone Encounter (Signed)
Caller Name: pt Call back phone #: [phone]  MEDICATION(S):  atorvastatin (LIPITOR) 10 MG tablet, takes 1/day, RX from previous provider expired SYNTHROID 75 MCG tablet, takes 1/day, rx frpm previous provider expired  Has the patient contacted their pharmacy? Yes, new RX needed for 90 day supply thru mail order  Preferred Pharmacy:  [place], [place] to [facility] Phone:  [phone]  Fax:  [phone]

## 2021-12-29 MED ORDER — ATORVASTATIN CALCIUM 10 MG PO TABS: 10.0000 mg | ORAL_TABLET | Freq: Every day | ORAL | 3 refills | Status: DC

## 2021-12-29 MED ORDER — SYNTHROID 75 MCG PO TABS: 75.0000 ug | ORAL_TABLET | Freq: Every day | ORAL | 1 refills | Status: DC

## 2021-12-29 NOTE — Telephone Encounter (Signed)
LVM for patient to schedule lab appointment

## 2022-01-01 NOTE — Telephone Encounter (Signed)
Pt is wondering if she is suppose to get her labs done now or for the next fill on these scripts. She also wants to know if the scripts have been filled. I let her know it looks like her scripts were filled and she needed to schedule labs. She wants you to call her back and let her know.

## 2022-01-02 NOTE — Telephone Encounter (Signed)
LVM informing patient to schedule lab appointment.

## 2022-01-03 ENCOUNTER — Telehealth: Payer: Self-pay | Admitting: Nurse Practitioner

## 2022-01-03 NOTE — Telephone Encounter (Signed)
Pt called saying she was returning Brittany's call about lab work. Pt asking what is/was the need for lab work when she had it in August..Marland KitchenMarland Kitchen

## 2022-01-03 NOTE — Telephone Encounter (Signed)
Patient states she normally only gets lab work once a year and does not understand why she needs labs when she just had blood work done in  07/2021. Pt would like a call to inform her of the providers thought process and why she feels additional labs are needed.

## 2022-01-04 NOTE — Telephone Encounter (Signed)
Left VM tp rtn call to discuss call. Dm/cma

## 2022-01-04 NOTE — Telephone Encounter (Signed)
Patient called back and advised of recommendations.   She will call us back. Dm/cma

## 2022-02-22 ENCOUNTER — Other Ambulatory Visit: Payer: Self-pay

## 2022-02-22 ENCOUNTER — Ambulatory Visit (INDEPENDENT_AMBULATORY_CARE_PROVIDER_SITE_OTHER): Payer: BC Managed Care – PPO

## 2022-02-22 DIAGNOSIS — Z23 Encounter for immunization: Secondary | ICD-10-CM

## 2022-02-22 NOTE — Progress Notes (Signed)
Per orders of Christell Constant, NP, injection 2nd Shingles vaccine given by Armandina Gemma, cma.  Patient tolerated injection well.  ? ?

## 2022-03-26 ENCOUNTER — Telehealth: Payer: Self-pay | Admitting: Nurse Practitioner

## 2022-03-26 NOTE — Telephone Encounter (Signed)
Pt is needing a call to [facility] put in to cancel her SYNTHROID 75 MCG tablet [[id number]] prescription and to order the generic version of this. Synthroid is $122. Which is to expensive.  ? ?[facility] [place], [place] [facility]  ?[address], [place] [address]  ?Phone:  [phone]  Fax:  [phone]  ?

## 2022-03-27 NOTE — Telephone Encounter (Signed)
Chart supports Rx  ?Last seen 12/05/21 ?Next OV 12/07/22 ?

## 2022-03-28 ENCOUNTER — Telehealth: Payer: Self-pay | Admitting: Nurse Practitioner

## 2022-03-28 NOTE — Telephone Encounter (Signed)
Mail order pharmacy states she needs Levothyroxine sent in to help with cost ?

## 2022-03-28 NOTE — Telephone Encounter (Signed)
Pt stated she wants her thyroid medication to be generic. It is going to be too high for her. She was told by pharmacist that it needs to be recoded. ?

## 2022-03-30 NOTE — Telephone Encounter (Signed)
LVM for patient to call and schedule lab appointment so that refills can be determined.  ?

## 2022-04-13 NOTE — Telephone Encounter (Signed)
Pt called and states her insurance only covers TSH/T4 testing. She said she's only had labs yearly previous to now. She said she's had insurance coverage issues before and may have to change providers if provider requires testing every 6 months.  ? ?Pt requesting call back to notify. Ph # [phone] ?

## 2022-04-18 ENCOUNTER — Encounter: Payer: Self-pay | Admitting: Nurse Practitioner

## 2022-04-18 NOTE — Telephone Encounter (Signed)
Annual lab testing is done only if levels are stable and she has no symptoms to indicate possible change in thyroid function. So I can not guarantee that labs will only be done once a year. ?

## 2022-04-24 ENCOUNTER — Other Ambulatory Visit (INDEPENDENT_AMBULATORY_CARE_PROVIDER_SITE_OTHER): Payer: BC Managed Care – PPO

## 2022-04-24 ENCOUNTER — Telehealth: Payer: Self-pay | Admitting: Nurse Practitioner

## 2022-04-24 DIAGNOSIS — E039 Hypothyroidism, unspecified: Secondary | ICD-10-CM

## 2022-04-24 LAB — TSH: TSH: 0.74 u[IU]/mL (ref 0.35–5.50)

## 2022-04-24 LAB — T4, FREE: Free T4: 1.48 ng/dL (ref 0.60–1.60)

## 2022-04-24 NOTE — Telephone Encounter (Signed)
Pt called after leaving appt to make sure the generic version of synthroid be called in.  ?

## 2022-04-26 NOTE — Telephone Encounter (Signed)
Pt called back regarding this refill... see first msg. ?

## 2022-04-27 MED ORDER — SYNTHROID 75 MCG PO TABS: 75.0000 ug | ORAL_TABLET | Freq: Every day | ORAL | 1 refills | Status: DC

## 2022-04-27 MED ORDER — LEVOTHYROXINE SODIUM 75 MCG PO TABS: 75.0000 ug | ORAL_TABLET | Freq: Every day | ORAL | 1 refills | Status: DC

## 2022-04-27 NOTE — Telephone Encounter (Signed)
Pt upset that she has not been contacted about updating RX to generic synthroid. Sending direct to provider. ?

## 2022-04-27 NOTE — Telephone Encounter (Signed)
Pt called back checking on status of this refill... ?

## 2022-05-02 ENCOUNTER — Ambulatory Visit: Payer: BC Managed Care – PPO | Admitting: Family Medicine

## 2022-05-02 ENCOUNTER — Other Ambulatory Visit: Payer: Self-pay

## 2022-05-02 DIAGNOSIS — S86011A Strain of right Achilles tendon, initial encounter: Secondary | ICD-10-CM | POA: Diagnosis not present

## 2022-05-02 DIAGNOSIS — E039 Hypothyroidism, unspecified: Secondary | ICD-10-CM

## 2022-05-02 MED ORDER — LEVOTHYROXINE SODIUM 75 MCG PO TABS: 75.0000 ug | ORAL_TABLET | Freq: Every day | ORAL | 1 refills | Status: DC

## 2022-05-02 NOTE — Telephone Encounter (Signed)
Spoke with patient informing her that I called pharmacy and was informed that she picked up a 10 day supply of the medication. Pt states she did this in order to have medication to last until her mail order comes in. I informed patient that we were not aware that she had switched completely over to mail-order and patient states the last time she got her Rx was only suppose to be a one time thing from the pharmacy and she thought we knew that. She states she found out that her Rx was more expensive picking them up from the pharmacy instead of getting them mailed to her home. I informed patient we would send in Rx and update her chart with this information. Pt still would like to be contacted by management and I informed her that I would make our manager aware.  ?

## 2022-05-02 NOTE — Telephone Encounter (Signed)
Pt is now wanting this script sent to [facility] mail order service ?

## 2022-05-08 DIAGNOSIS — S86001A Unspecified injury of right Achilles tendon, initial encounter: Secondary | ICD-10-CM | POA: Diagnosis not present

## 2022-05-08 DIAGNOSIS — G8918 Other acute postprocedural pain: Secondary | ICD-10-CM | POA: Diagnosis not present

## 2022-05-08 DIAGNOSIS — Y999 Unspecified external cause status: Secondary | ICD-10-CM | POA: Diagnosis not present

## 2022-05-08 DIAGNOSIS — S86011A Strain of right Achilles tendon, initial encounter: Secondary | ICD-10-CM | POA: Diagnosis not present

## 2022-05-08 DIAGNOSIS — X58XXXA Exposure to other specified factors, initial encounter: Secondary | ICD-10-CM | POA: Diagnosis not present

## 2022-05-08 NOTE — Telephone Encounter (Signed)
Documented in patient chart, phone conversation on 05/03/22

## 2022-05-22 DIAGNOSIS — S86011A Strain of right Achilles tendon, initial encounter: Secondary | ICD-10-CM | POA: Diagnosis not present

## 2022-06-01 DIAGNOSIS — S86011A Strain of right Achilles tendon, initial encounter: Secondary | ICD-10-CM | POA: Diagnosis not present

## 2022-06-06 DIAGNOSIS — M25671 Stiffness of right ankle, not elsewhere classified: Secondary | ICD-10-CM | POA: Diagnosis not present

## 2022-06-06 DIAGNOSIS — S86011D Strain of right Achilles tendon, subsequent encounter: Secondary | ICD-10-CM | POA: Diagnosis not present

## 2022-06-13 DIAGNOSIS — M25671 Stiffness of right ankle, not elsewhere classified: Secondary | ICD-10-CM | POA: Diagnosis not present

## 2022-06-13 DIAGNOSIS — S86011D Strain of right Achilles tendon, subsequent encounter: Secondary | ICD-10-CM | POA: Diagnosis not present

## 2022-06-20 DIAGNOSIS — M25671 Stiffness of right ankle, not elsewhere classified: Secondary | ICD-10-CM | POA: Diagnosis not present

## 2022-06-20 DIAGNOSIS — S86011D Strain of right Achilles tendon, subsequent encounter: Secondary | ICD-10-CM | POA: Diagnosis not present

## 2022-07-04 DIAGNOSIS — S86011D Strain of right Achilles tendon, subsequent encounter: Secondary | ICD-10-CM | POA: Diagnosis not present

## 2022-07-04 DIAGNOSIS — M25671 Stiffness of right ankle, not elsewhere classified: Secondary | ICD-10-CM | POA: Diagnosis not present

## 2022-07-09 DIAGNOSIS — M25671 Stiffness of right ankle, not elsewhere classified: Secondary | ICD-10-CM | POA: Diagnosis not present

## 2022-08-03 ENCOUNTER — Encounter: Payer: BC Managed Care – PPO | Admitting: Nurse Practitioner

## 2022-09-19 DIAGNOSIS — Z96641 Presence of right artificial hip joint: Secondary | ICD-10-CM | POA: Diagnosis not present

## 2022-09-28 ENCOUNTER — Encounter: Payer: Self-pay | Admitting: Internal Medicine

## 2022-09-28 ENCOUNTER — Ambulatory Visit (INDEPENDENT_AMBULATORY_CARE_PROVIDER_SITE_OTHER): Payer: BC Managed Care – PPO | Admitting: Internal Medicine

## 2022-09-28 VITALS — BP 106/74 | HR 78 | Temp 98.2°F | Ht 66.0 in | Wt 156.4 lb

## 2022-09-28 DIAGNOSIS — E782 Mixed hyperlipidemia: Secondary | ICD-10-CM

## 2022-09-28 DIAGNOSIS — E039 Hypothyroidism, unspecified: Secondary | ICD-10-CM

## 2022-09-28 DIAGNOSIS — Z23 Encounter for immunization: Secondary | ICD-10-CM

## 2022-09-28 DIAGNOSIS — E785 Hyperlipidemia, unspecified: Secondary | ICD-10-CM | POA: Insufficient documentation

## 2022-09-28 NOTE — Assessment & Plan Note (Signed)
Reviewed recent labs and at goal. No change will continue lipitor 10 mg daily. Can refill if needed.

## 2022-09-28 NOTE — Progress Notes (Signed)
   Subjective:   Patient ID: Tanya Brooks, female    DOB: 08-Jul-1956, 66 y.o.   MRN: [id number]  HPI The patient is a 66 YO female coming in for TOC, no concerns.  PMH, [facility], social history reviewed and updated  Review of Systems  Constitutional: Negative.   HENT: Negative.    Eyes: Negative.   Respiratory:  Negative for cough, chest tightness and shortness of breath.   Cardiovascular:  Negative for chest pain, palpitations and leg swelling.  Gastrointestinal:  Negative for abdominal distention, abdominal pain, constipation, diarrhea, nausea and vomiting.  Musculoskeletal: Negative.   Skin: Negative.   Neurological: Negative.   Psychiatric/Behavioral: Negative.      Objective:  Physical Exam Constitutional:      Appearance: She is well-developed.  HENT:     Head: Normocephalic and atraumatic.  Cardiovascular:     Rate and Rhythm: Normal rate and regular rhythm.  Pulmonary:     Effort: Pulmonary effort is normal. No respiratory distress.     Breath sounds: Normal breath sounds. No wheezing or rales.  Abdominal:     General: Bowel sounds are normal. There is no distension.     Palpations: Abdomen is soft.     Tenderness: There is no abdominal tenderness. There is no rebound.  Musculoskeletal:     Cervical back: Normal range of motion.  Skin:    General: Skin is warm and dry.  Neurological:     Mental Status: She is alert and oriented to person, place, and time.     Coordination: Coordination normal.     Vitals:   09/28/22 1357  BP: 106/74  Pulse: 78  Temp: 98.2 F (36.8 C)  TempSrc: Oral  SpO2: 99%  Weight: 156 lb 6.4 oz (70.9 kg)  Height: '5\' 6"'$  (1.676 m)   Flu shot given at visit  Assessment & Plan:  Visit time 25 minutes in face to face communication with patient and coordination of care, additional 10 minutes spent in record review, coordination or care, ordering tests, communicating/referring to other healthcare professionals, documenting in medical  records all on the same day of the visit for total time 35 minutes spent on the visit.

## 2022-09-28 NOTE — Addendum Note (Signed)
Addended by: Marcina Millard on: 09/28/2022 03:03 PM   Modules accepted: Orders

## 2022-09-28 NOTE — Assessment & Plan Note (Signed)
Recent levels reviewed and normal. Continue synthroid 75 mcg daily can refill when due.

## 2022-10-01 ENCOUNTER — Encounter: Payer: BC Managed Care – PPO | Admitting: Internal Medicine

## 2022-10-14 ENCOUNTER — Other Ambulatory Visit: Payer: Self-pay | Admitting: Nurse Practitioner

## 2022-10-14 DIAGNOSIS — E039 Hypothyroidism, unspecified: Secondary | ICD-10-CM

## 2022-10-15 NOTE — Telephone Encounter (Signed)
Chart supports Rx Last OV: 11/2021 Next OV: 11/2022

## 2022-12-04 ENCOUNTER — Other Ambulatory Visit: Payer: Self-pay | Admitting: Internal Medicine

## 2022-12-04 DIAGNOSIS — Z1231 Encounter for screening mammogram for malignant neoplasm of breast: Secondary | ICD-10-CM

## 2022-12-07 ENCOUNTER — Encounter: Payer: BC Managed Care – PPO | Admitting: Nurse Practitioner

## 2022-12-11 ENCOUNTER — Other Ambulatory Visit: Payer: Self-pay | Admitting: Nurse Practitioner

## 2022-12-11 DIAGNOSIS — E782 Mixed hyperlipidemia: Secondary | ICD-10-CM

## 2023-01-05 ENCOUNTER — Other Ambulatory Visit: Payer: Self-pay | Admitting: Nurse Practitioner

## 2023-01-05 DIAGNOSIS — E039 Hypothyroidism, unspecified: Secondary | ICD-10-CM

## 2023-01-16 LAB — HM DEXA SCAN

## 2023-01-25 ENCOUNTER — Ambulatory Visit
Admission: RE | Admit: 2023-01-25 | Discharge: 2023-01-25 | Disposition: A | Discharge status: Home / Self Care | Payer: BC Managed Care – PPO | Source: Ambulatory Visit | Attending: Internal Medicine | Admitting: Internal Medicine

## 2023-01-25 DIAGNOSIS — Z1231 Encounter for screening mammogram for malignant neoplasm of breast: Secondary | ICD-10-CM | POA: Diagnosis not present

## 2023-01-30 ENCOUNTER — Encounter: Payer: Self-pay | Admitting: Internal Medicine

## 2023-01-30 LAB — HM MAMMOGRAPHY

## 2023-06-02 ENCOUNTER — Other Ambulatory Visit: Payer: Self-pay | Admitting: Internal Medicine

## 2023-06-02 DIAGNOSIS — E782 Mixed hyperlipidemia: Secondary | ICD-10-CM

## 2023-08-15 IMAGING — MG MM DIGITAL SCREENING BILAT W/ TOMO AND CAD
8 series · 8 of 24 positions shown · non-contrast
Comparison: Previous exam(s).

CLINICAL DATA: Screening.

EXAM:
DIGITAL SCREENING BILATERAL MAMMOGRAM WITH TOMOSYNTHESIS AND CAD
TECHNIQUE: Bilateral screening digital craniocaudal and mediolateral oblique
mammograms were obtained. Bilateral screening digital breast
tomosynthesis was performed. The images were evaluated with
computer-aided detection.

[R CC synth-2D]
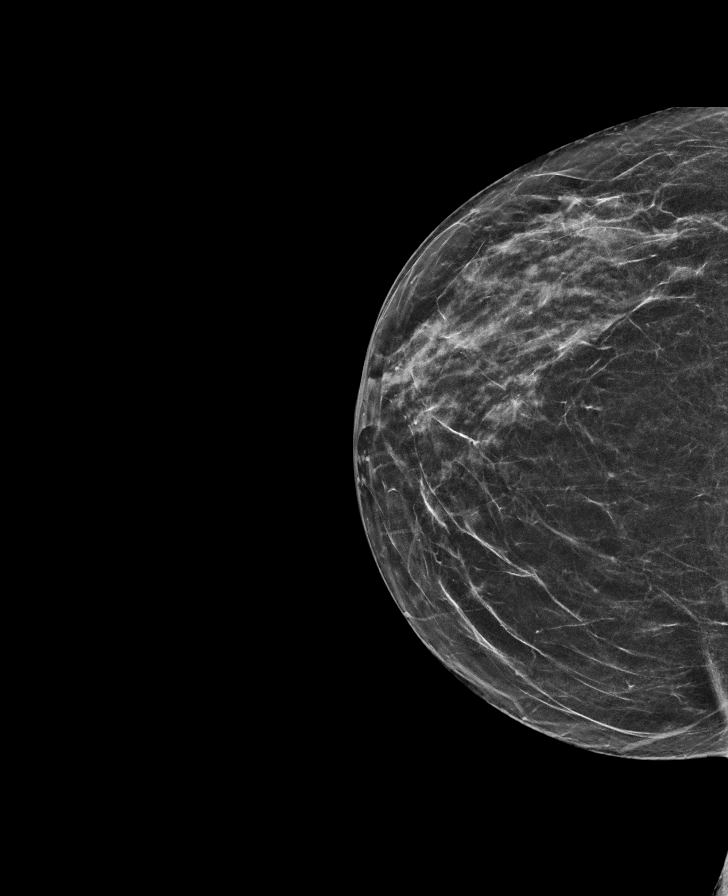

[L CC synth-2D]
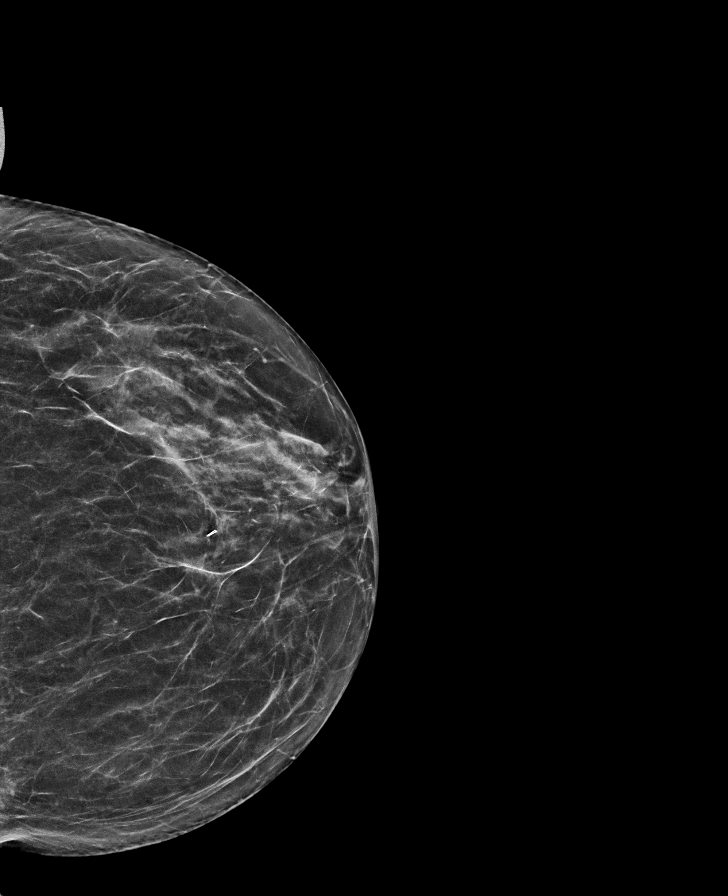

[R MLO synth-2D]
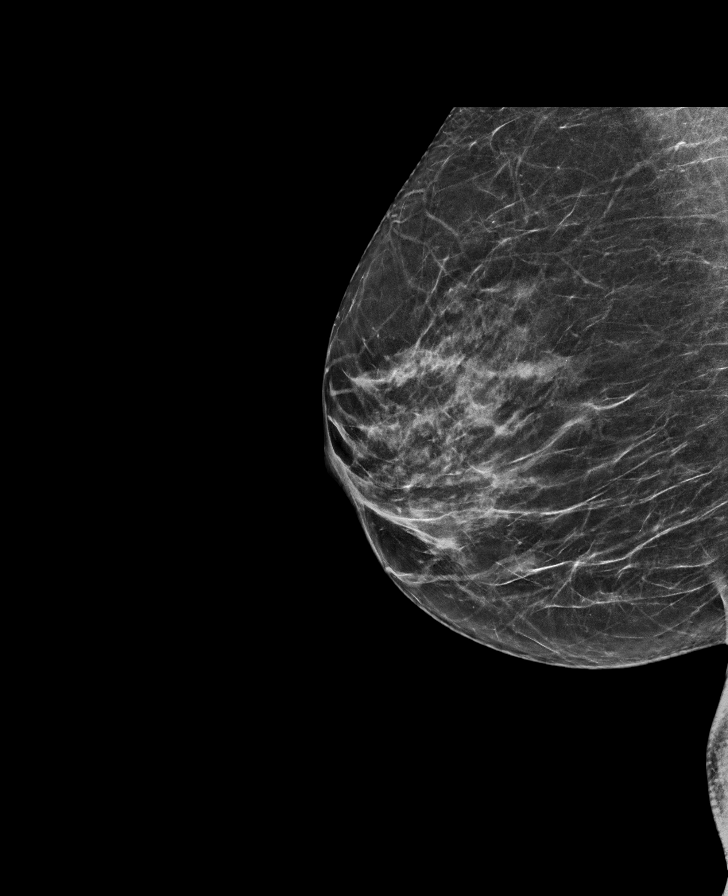

[L MLO synth-2D]
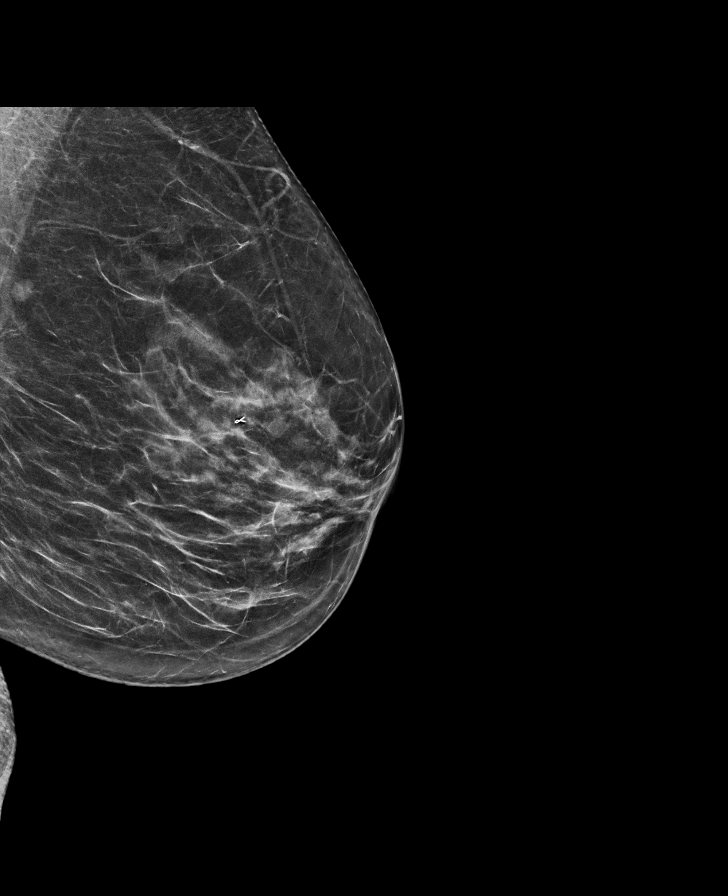

[R CC tomo · tomo slice 29/57.0]
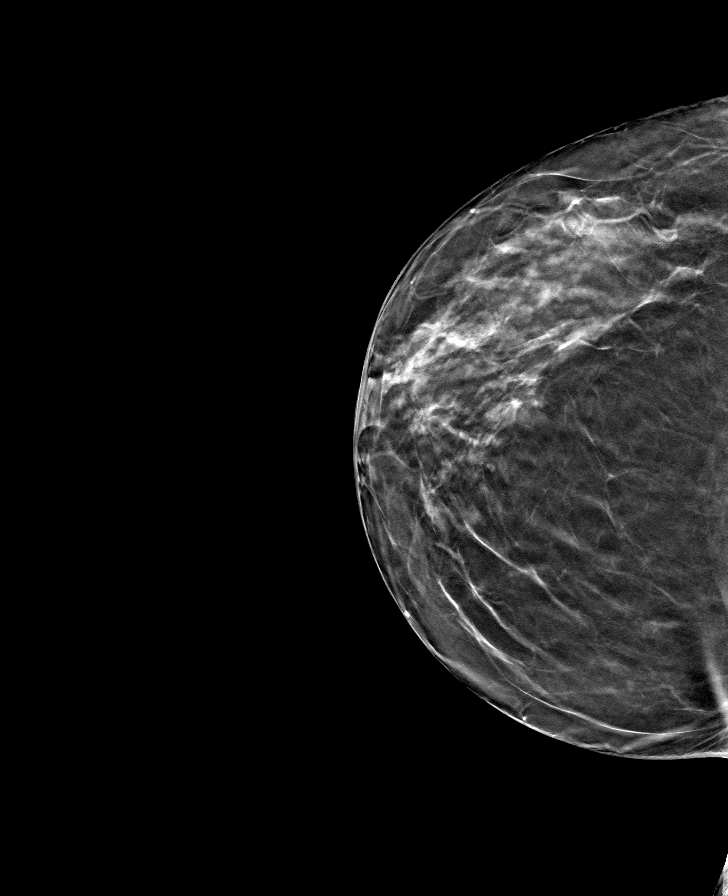

[R MLO tomo · tomo slice 31/60.0]
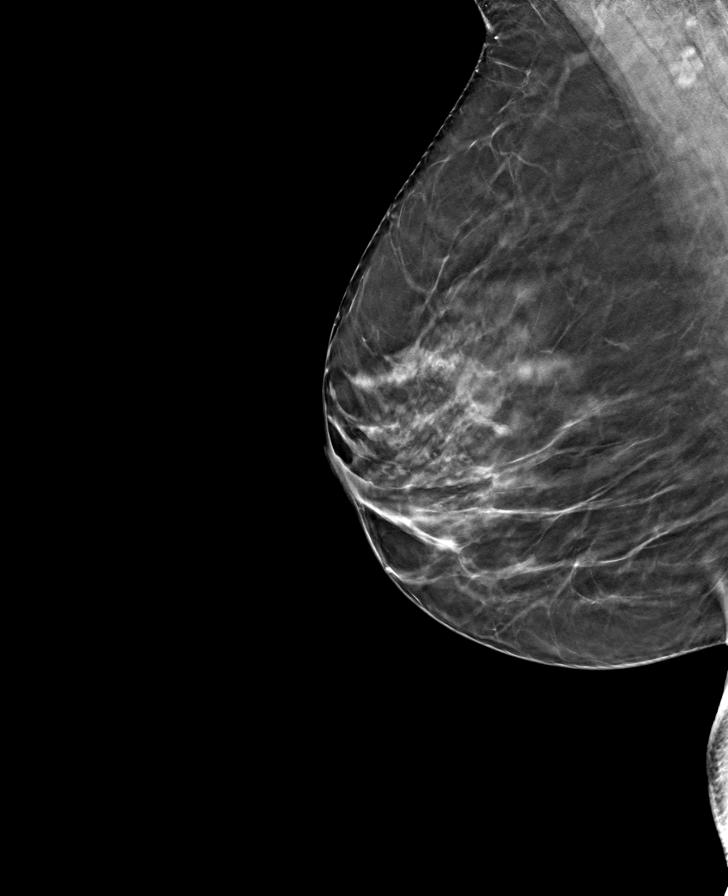

[L MLO tomo · tomo slice 35/68.0]
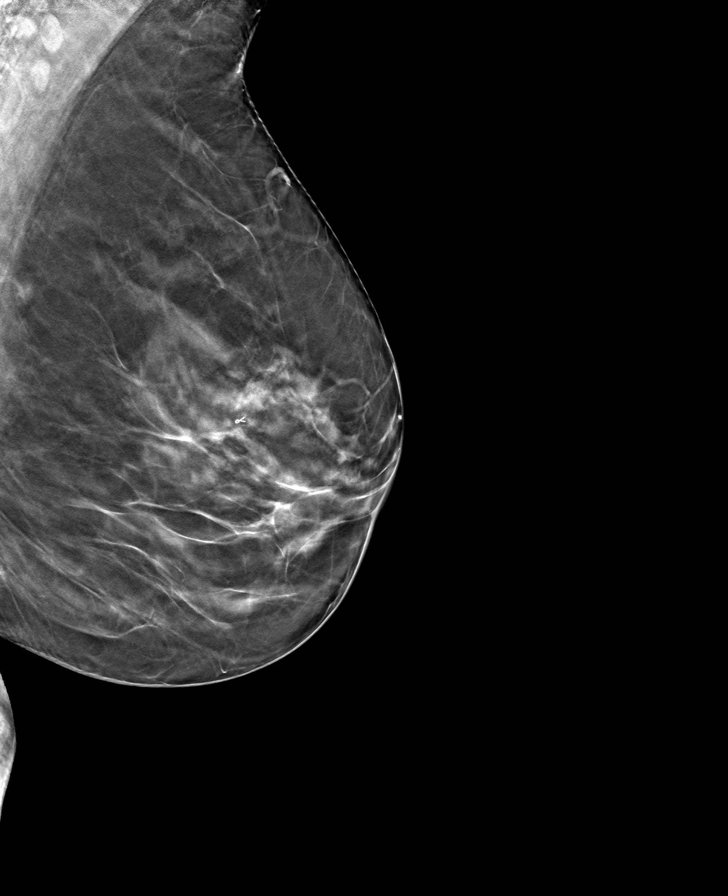

[L CC tomo · tomo slice 30/59.0]
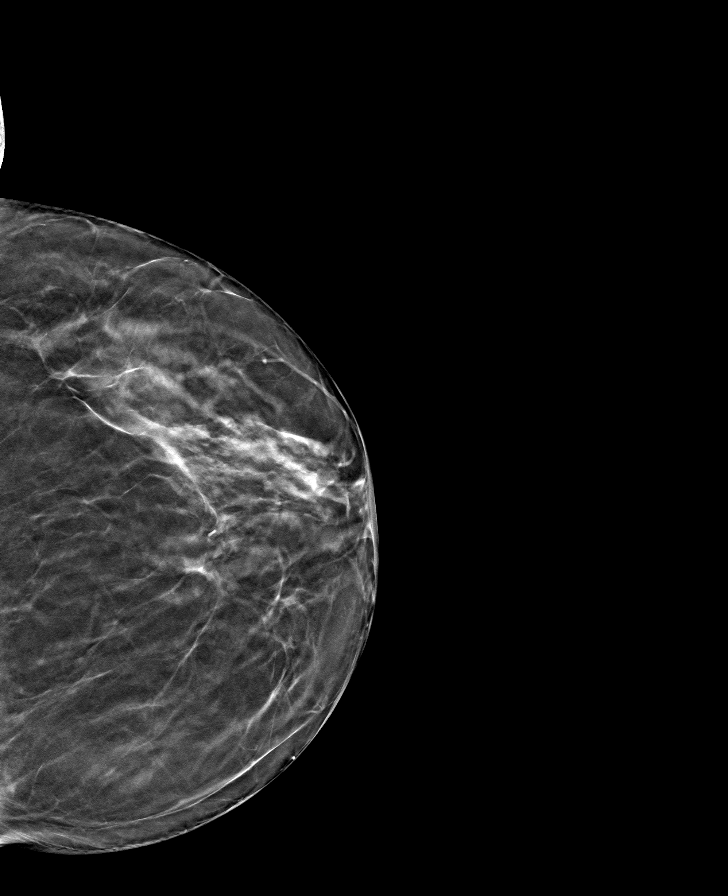

[8 of 24 positions shown; findings below may reference images not displayed]

ACR Breast Density Category c: The breast tissue is heterogeneously
dense, which may obscure small masses.
FINDINGS: There are no findings suspicious for malignancy.
IMPRESSION: No mammographic evidence of malignancy. A result letter of this
screening mammogram will be mailed directly to the patient.

RECOMMENDATION:
Screening mammogram in one year. (Code:Q3-W-BC3)

BI-RADS CATEGORY  1: Negative.

## 2023-10-15 ENCOUNTER — Telehealth: Payer: Self-pay | Admitting: Internal Medicine

## 2023-10-15 NOTE — Telephone Encounter (Signed)
Patient states she has a change in pharmacy, she will now be using [facility]. She asked that her atorvastatin (LIPITOR) 10 MG tablet and levothyroxine (SYNTHROID) 75 MCG tablet be sent through [facility], with a 90 day supply.

## 2023-10-16 ENCOUNTER — Other Ambulatory Visit: Payer: Self-pay

## 2023-10-16 DIAGNOSIS — E782 Mixed hyperlipidemia: Secondary | ICD-10-CM

## 2023-10-16 DIAGNOSIS — E039 Hypothyroidism, unspecified: Secondary | ICD-10-CM

## 2023-10-16 MED ORDER — ATORVASTATIN CALCIUM 10 MG PO TABS: 10.0000 mg | ORAL_TABLET | Freq: Every day | ORAL | 1 refills | Status: DC

## 2023-10-16 MED ORDER — LEVOTHYROXINE SODIUM 75 MCG PO TABS: 75.0000 ug | ORAL_TABLET | Freq: Every day | ORAL | 3 refills | Status: DC

## 2023-10-16 NOTE — Telephone Encounter (Signed)
Done and changed preference of pharmacy

## 2023-12-19 ENCOUNTER — Other Ambulatory Visit: Payer: Self-pay | Admitting: Internal Medicine

## 2023-12-19 DIAGNOSIS — Z1231 Encounter for screening mammogram for malignant neoplasm of breast: Secondary | ICD-10-CM

## 2023-12-27 ENCOUNTER — Encounter: Payer: BC Managed Care – PPO | Admitting: Internal Medicine

## 2023-12-28 ENCOUNTER — Ambulatory Visit
Admission: RE | Admit: 2023-12-28 | Discharge: 2023-12-28 | Disposition: A | Discharge status: Home / Self Care | Payer: Medicare Other | Source: Ambulatory Visit | Attending: Family Medicine

## 2023-12-28 ENCOUNTER — Ambulatory Visit (INDEPENDENT_AMBULATORY_CARE_PROVIDER_SITE_OTHER): Payer: Medicare Other | Admitting: Radiology

## 2023-12-28 VITALS — BP 114/74 | HR 67 | Temp 97.6°F | Resp 18 | Ht 67.0 in | Wt 155.0 lb

## 2023-12-28 DIAGNOSIS — R059 Cough, unspecified: Secondary | ICD-10-CM

## 2023-12-28 NOTE — ED Provider Notes (Signed)
 [facility]    CSN: [id number] Arrival date & time: 12/28/23  1332      History   Chief Complaint Chief Complaint  Patient presents with   Cough    Lingering cough for 6 weeks. Possible walking  pneumonia. Need chest x-ray. - Entered by patient    HPI Tanya Brooks is a 68 y.o. female.    Cough Associated symptoms: chills   Associated symptoms: no chest pain, no fever, no sore throat and no wheezing   Cough for 6 weeks, feels some mucus in her chest but unable to cough it up.  Has had chills for 2 weeks.  Admits fatigue. Denies documented fever, rhinorrhea, nasal congestion, sore throat, chest pain, palpitations, abdominal pain, nausea, vomiting, diarrhea.  Grandchildren have had minor URI symptoms.  Coworker recently diagnosed with COVID.  Denies history of heart or lung issues.  Denies smoking.  Denies lower extremity pain or swelling. Takes DayQuil and NyQuil when needed with really.  She is because the cough has not resolved.  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   S/P left knee surgery 12/05/2021   Benign neoplasm of skin of lower limb 10/20/2021   Dermatofibroma 10/20/2021   Seborrheic keratosis 10/20/2021   Carotid artery calcification 07/19/2021   Hypothyroidism 07/19/2021    Past Surgical History:  Procedure Laterality Date   BREAST BIOPSY Left    COLONOSCOPY     age 53- normal per pt    JOINT REPLACEMENT     Partial KneeReplacement- June 2012, Hip Replacement Aug.2022   KNEE ARTHROSCOPY Left    KNEE SURGERY     1970's    MEDIAL PARTIAL KNEE REPLACEMENT Left    TONSILLECTOMY     WISDOM TOOTH EXTRACTION      OB History   No obstetric history on file.      Home  Medications    Prior to Admission medications   Medication Sig Start Date End Date Taking? Authorizing Provider  atorvastatin  (LIPITOR) 10 MG tablet Take 1 tablet (10 mg total) by mouth daily. 10/16/23   Rollene Almarie DELENA, MD  Calcium  Carb-Cholecalciferol (CALCIUM  600 + D PO) Take by mouth.    [provider]  levothyroxine  (SYNTHROID ) 75 MCG tablet Take 1 tablet (75 mcg total) by mouth daily. 10/16/23   Rollene Almarie DELENA, MD  LUTEIN PO Take by mouth.    [provider]  Multiple Vitamins-Minerals (WOMENS 50+ ADVANCED) CAPS Take by mouth.    [provider]  Omega-3 Fatty Acids (FISH OIL) 1200 MG CAPS Take 1 capsule by mouth daily.    [provider]  VITAMIN D PO Take by mouth.    [provider]    Family History Family History  Problem Relation Age of Onset   Breast cancer Mother    Cancer Mother    Cancer Father    Colon cancer Neg Hx    Colon polyps Neg Hx    Esophageal cancer Neg Hx    Rectal cancer Neg Hx    Stomach cancer Neg Hx     Social History Social History   Tobacco  Use   Smoking status: Never   Smokeless tobacco: Never  Vaping Use   Vaping status: Never Used  Substance Use Topics   Alcohol use: Never   Drug use: Never     Allergies   Patient has no known allergies.   Review of Systems Review of Systems  Constitutional:  Positive for chills and fatigue. Negative for fever.  HENT:  Negative for congestion, postnasal drip and sore throat.   Respiratory:  Positive for cough. Negative for wheezing.   Cardiovascular:  Negative for chest pain and palpitations.  Gastrointestinal:  Negative for abdominal pain, diarrhea and nausea.  Neurological:  Negative for dizziness.     Physical Exam Triage Vital Signs ED Triage Vitals  Encounter Vitals Group     BP 12/28/23 1347 114/74     Systolic BP Percentile --      Diastolic BP Percentile --      Pulse Rate 12/28/23 1347 67     Resp 12/28/23 1347 18      Temp 12/28/23 1347 97.6 F (36.4 C)     Temp Source 12/28/23 1347 Oral     SpO2 12/28/23 1347 97 %     Weight 12/28/23 1346 155 lb (70.3 kg)     Height 12/28/23 1346 5' 7 (1.702 m)     Head Circumference --      Peak Flow --      Pain Score 12/28/23 1346 0     Pain Loc --      Pain Education --      Exclude from Growth Chart --    No data found.  Updated Vital Signs BP 114/74 (BP Location: Right Arm)   Pulse 67   Temp 97.6 F (36.4 C) (Oral)   Resp 18   Ht 5' 7 (1.702 m)   Wt 155 lb (70.3 kg)   SpO2 97%   BMI 24.28 kg/m   Visual Acuity Right Eye Distance:   Left Eye Distance:   Bilateral Distance:    Right Eye Near:   Left Eye Near:    Bilateral Near:     Physical Exam Vitals and nursing note reviewed.  Constitutional:      Appearance: She is not ill-appearing.  HENT:     Head: Normocephalic and atraumatic.     Right Ear: Tympanic membrane and ear canal normal.     Left Ear: Tympanic membrane normal.     Nose: No rhinorrhea.     Mouth/Throat:     Pharynx: Oropharyngeal exudate present.  Eyes:     Conjunctiva/sclera: Conjunctivae normal.  Cardiovascular:     Rate and Rhythm: Normal rate and regular rhythm.     Heart sounds: Normal heart sounds.  Pulmonary:     Effort: Pulmonary effort is normal. No respiratory distress.     Breath sounds: Normal breath sounds. No wheezing, rhonchi or rales.  Musculoskeletal:     Cervical back: Neck supple.  Skin:    General: Skin is warm.  Neurological:     Mental Status: She is alert and oriented to person, place, and time.      UC Treatments / Results  Labs (all labs ordered are listed, but only abnormal results are displayed) Labs Reviewed - No data to display  EKG   Radiology No results found.  Procedures Procedures (including critical care time)  Medications Ordered in UC Medications - No data to display  Initial Impression / Assessment and Plan / UC Course  I have reviewed  the triage vital  signs and the nursing notes.      68 year old female reports cough for.  Has chills but no documented fever.  She is hearing, afebrile, nontoxic.  Lungs are clear to auscultation, oxygen saturation 97%.  Chest x-ray independently viewed by me no evidence of pneumonia.  Will contact patient if there is a radiologic discrepancy.  Patient counseled to follow-up with her PCP for further evaluation if cough fails to resolve  Final Clinical Impressions(s) / UC Diagnoses   Final diagnoses:  Cough, unspecified type   Discharge Instructions   None    ED Prescriptions   None    PDMP not reviewed this encounter.   Kalecia Hartney, [place] 12/28/23 1414

## 2023-12-28 NOTE — ED Triage Notes (Signed)
 Pt presents with complaints of cough, fatigue, and chills x 6 weeks. Pt denies fevers at home. Pt states I just feel like I cannot get over this, unsure if I have walking pneumonia. Pt would like chest x-ray today. No medications taken at home for symptoms.

## 2023-12-28 NOTE — Discharge Instructions (Signed)
 The x-ray reading we discussed is preliminary. Your x-ray will be read by a radiologist in next few hours. If there is a discrepancy, you will be contacted, and instructed on a new plan for you care.    Follow-up with your primary care doctor if your cough fails to improve.  Continue over-the-counter cough medicines as needed

## 2024-01-06 ENCOUNTER — Ambulatory Visit (INDEPENDENT_AMBULATORY_CARE_PROVIDER_SITE_OTHER): Payer: Medicare Other | Admitting: Internal Medicine

## 2024-01-06 ENCOUNTER — Encounter: Payer: Self-pay | Admitting: Internal Medicine

## 2024-01-06 VITALS — BP 118/80 | HR 76 | Temp 97.8°F | Ht 67.0 in | Wt 159.0 lb

## 2024-01-06 DIAGNOSIS — E039 Hypothyroidism, unspecified: Secondary | ICD-10-CM | POA: Diagnosis not present

## 2024-01-06 DIAGNOSIS — E2839 Other primary ovarian failure: Secondary | ICD-10-CM

## 2024-01-06 DIAGNOSIS — Z0000 Encounter for general adult medical examination without abnormal findings: Secondary | ICD-10-CM

## 2024-01-06 DIAGNOSIS — E782 Mixed hyperlipidemia: Secondary | ICD-10-CM | POA: Diagnosis not present

## 2024-01-06 DIAGNOSIS — I6529 Occlusion and stenosis of unspecified carotid artery: Secondary | ICD-10-CM

## 2024-01-06 DIAGNOSIS — D2370 Other benign neoplasm of skin of unspecified lower limb, including hip: Secondary | ICD-10-CM | POA: Diagnosis not present

## 2024-01-06 LAB — LIPID PANEL
Cholesterol: 174 mg/dL (ref 0–200)
HDL: 54.6 mg/dL (ref >39.00)
LDL Cholesterol: 88 mg/dL (ref 0–99)
NonHDL: 119.75
Total CHOL/HDL Ratio: 3
Triglycerides: 157 mg/dL — ABNORMAL HIGH (ref 0.0–149.0)
VLDL: 31.4 mg/dL (ref 0.0–40.0)

## 2024-01-06 LAB — COMPREHENSIVE METABOLIC PANEL
ALT: 18 U/L (ref 0–35)
AST: 19 U/L (ref 0–37)
Albumin: 4.4 g/dL (ref 3.5–5.2)
Alkaline Phosphatase: 76 U/L (ref 39–117)
BUN: 14 mg/dL (ref 6–23)
CO2: 27 meq/L (ref 19–32)
Calcium: 9.8 mg/dL (ref 8.4–10.5)
Chloride: 105 meq/L (ref 96–112)
Creatinine, Ser: 0.7 mg/dL (ref 0.40–1.20)
GFR: 90.87 mL/min (ref >60.00)
Glucose, Bld: 81 mg/dL (ref 70–99)
Potassium: 4 meq/L (ref 3.5–5.1)
Sodium: 139 meq/L (ref 135–145)
Total Bilirubin: 1 mg/dL (ref 0.2–1.2)
Total Protein: 7.1 g/dL (ref 6.0–8.3)

## 2024-01-06 LAB — CBC
HCT: 41.5 % (ref 36.0–46.0)
Hemoglobin: 13.9 g/dL (ref 12.0–15.0)
MCHC: 33.4 g/dL (ref 30.0–36.0)
MCV: 94.8 fL (ref 78.0–100.0)
Platelets: 249 10*3/uL (ref 150.0–400.0)
RBC: 4.38 Mil/uL (ref 3.87–5.11)
RDW: 13.1 % (ref 11.5–15.5)
WBC: 6.8 10*3/uL (ref 4.0–10.5)

## 2024-01-06 LAB — TSH: TSH: 1.98 u[IU]/mL (ref 0.35–5.50)

## 2024-01-06 MED ORDER — LEVOTHYROXINE SODIUM 75 MCG PO TABS: 75.0000 ug | ORAL_TABLET | Freq: Every day | ORAL | 3 refills | Status: AC

## 2024-01-06 MED ORDER — ATORVASTATIN CALCIUM 10 MG PO TABS: 10.0000 mg | ORAL_TABLET | Freq: Every day | ORAL | 3 refills | Status: AC

## 2024-01-06 NOTE — Progress Notes (Unsigned)
   Subjective:   Patient ID: Tanya Brooks, female    DOB: 12-24-1955, 68 y.o.   MRN: [id number]  HPI The patient is a 68 YO female coming in for follow up chronic conditions see A/P for details.   Review of Systems  Constitutional: Negative.   HENT: Negative.    Eyes: Negative.   Respiratory:  Negative for cough, chest tightness and shortness of breath.   Cardiovascular:  Negative for chest pain, palpitations and leg swelling.  Gastrointestinal:  Negative for abdominal distention, abdominal pain, constipation, diarrhea, nausea and vomiting.  Musculoskeletal: Negative.   Skin: Negative.   Neurological: Negative.   Psychiatric/Behavioral: Negative.      Objective:  Physical Exam Constitutional:      Appearance: She is well-developed.  HENT:     Head: Normocephalic and atraumatic.  Cardiovascular:     Rate and Rhythm: Normal rate and regular rhythm.  Pulmonary:     Effort: Pulmonary effort is normal. No respiratory distress.     Breath sounds: Normal breath sounds. No wheezing or rales.  Abdominal:     General: Bowel sounds are normal. There is no distension.     Palpations: Abdomen is soft.     Tenderness: There is no abdominal tenderness. There is no rebound.  Musculoskeletal:     Cervical back: Normal range of motion.  Skin:    General: Skin is warm and dry.  Neurological:     Mental Status: She is alert and oriented to person, place, and time.     Coordination: Coordination normal.     Vitals:   01/06/24 1449  BP: 118/80  Pulse: 76  Temp: 97.8 F (36.6 C)  TempSrc: Oral  SpO2: 96%  Weight: 159 lb (72.1 kg)  Height: 5\' 7"  (1.702 m)    Assessment & Plan:

## 2024-01-06 NOTE — Patient Instructions (Signed)
  Tanya Brooks , Thank you for taking time to come for your Medicare Wellness Visit. I appreciate your ongoing commitment to your health goals. Please review the following plan we discussed and let me know if I can assist you in the future.   These are the goals we discussed:  Goals   None     This is a list of the screening recommended for you and due dates:  Health Maintenance  Topic Date Due   HIV Screening  Never done   Hepatitis C Screening  Never done   DEXA scan (bone density measurement)  Never done   COVID-19 Vaccine (8 - 2024-25 season) 12/07/2023   Medicare Annual Wellness Visit  01/05/2025   Mammogram  01/30/2025   Pap with HPV screening  12/05/2026   Colon Cancer Screening  12/23/2030   DTaP/Tdap/Td vaccine (2 - Td or Tdap) 10/21/2031   Pneumonia Vaccine  Completed   Flu Shot  Completed   Zoster (Shingles) Vaccine  Completed   HPV Vaccine  Aged Out

## 2024-01-06 NOTE — Assessment & Plan Note (Signed)
Checking lipid panel and CMP and CBC. Adjust as needed. Taking lipitor 10 mg daily.

## 2024-01-06 NOTE — Assessment & Plan Note (Signed)
Flu shot up to date. Pneumonia complete. Shingrix complete. Tetanus due 2032. Colonoscopy due 2032. Mammogram scheduled Feb 2025, pap smear aged out prior to recall and dexa ordered during visit as due. Counseled about sun safety and mole surveillance. Counseled about the dangers of distracted driving. Given 10 year screening recommendations.

## 2024-01-06 NOTE — Assessment & Plan Note (Signed)
Checking TSH and adjust as needed levothyroxine 75 mcg daily. Refilled today.

## 2024-01-06 NOTE — Progress Notes (Unsigned)
Subjective:    Tanya Brooks is a 68 y.o. female who presents for a Welcome to Medicare exam.        Objective:    Today's Vitals   01/06/24 1449 01/06/24 1458  BP: 118/80   Pulse: 76   Temp: 97.8 F (36.6 C)   TempSrc: Oral   SpO2: 96%   Weight: 159 lb (72.1 kg)   Height: 5\' 7"  (1.702 m)   PainSc:  0-No pain  Body mass index is 24.9 kg/m.  Medications Outpatient Encounter Medications as of 01/06/2024  Medication Sig   Calcium Carb-Cholecalciferol (CALCIUM 600 + D PO) Take by mouth.   LUTEIN PO Take by mouth.   Multiple Vitamins-Minerals (WOMENS 50+ ADVANCED) CAPS Take by mouth.   Omega-3 Fatty Acids (FISH OIL) 1200 MG CAPS Take 1 capsule by mouth daily.   VITAMIN D PO Take by mouth.   [DISCONTINUED] atorvastatin (LIPITOR) 10 MG tablet Take 1 tablet (10 mg total) by mouth daily.   [DISCONTINUED] levothyroxine (SYNTHROID) 75 MCG tablet Take 1 tablet (75 mcg total) by mouth daily.   atorvastatin (LIPITOR) 10 MG tablet Take 1 tablet (10 mg total) by mouth daily.   levothyroxine (SYNTHROID) 75 MCG tablet Take 1 tablet (75 mcg total) by mouth daily.   No facility-administered encounter medications on file as of 01/06/2024.     History: Past Medical History:  Diagnosis Date   Allergy    Anemia    past hx of anemia   Arthritis    OA   Hyperlipidemia    Hypotension    low normal    Thyroid disease    Past Surgical History:  Procedure Laterality Date   BREAST BIOPSY Left    COLONOSCOPY     age 87- normal per pt    JOINT REPLACEMENT     Partial KneeReplacement- June 2012, Hip Replacement Aug.2022   KNEE ARTHROSCOPY Left    KNEE SURGERY     1970's    MEDIAL PARTIAL KNEE REPLACEMENT Left    TONSILLECTOMY     WISDOM TOOTH EXTRACTION      Family History  Problem Relation Age of Onset   Breast cancer Mother    Cancer Mother    Cancer Father    Colon cancer Neg Hx    Colon polyps Neg Hx    Esophageal cancer Neg Hx    Rectal cancer Neg Hx    Stomach cancer  Neg Hx    Social History   Occupational History   Not on file  Tobacco Use   Smoking status: Never   Smokeless tobacco: Never  Vaping Use   Vaping status: Never Used  Substance and Sexual Activity   Alcohol use: Never   Drug use: Never   Sexual activity: Yes    Birth control/protection: Post-menopausal    Tobacco Counseling Counseling given: No   Immunizations and Health Maintenance Immunization History  Administered Date(s) Administered   Influenza,inj,Quad PF,6+ Mos 10/05/2020, 09/28/2022   Influenza-Unspecified 10/02/2021, 10/12/2023   Moderna Sars-Covid-2 Vaccination 01/18/2020, 02/14/2020, 04/06/2021   PFIZER(Purple Top)SARS-COV-2 Vaccination 10/10/2020, 04/07/2021, 09/21/2021   PNEUMOCOCCAL CONJUGATE-20 10/20/2021   Tdap 10/20/2021   Unspecified SARS-COV-2 Vaccination 10/12/2023   Zoster Recombinant(Shingrix) 10/20/2021, 02/22/2022   Health Maintenance Due  Topic Date Due   HIV Screening  Never done   Hepatitis C Screening  Never done   DEXA SCAN  Never done   COVID-19 Vaccine (8 - 2024-25 season) 12/07/2023    Activities of Daily Living  01/06/2024    2:58 PM  In your present state of health, do you have any difficulty performing the following activities:  Hearing? 0  Vision? 0  Difficulty concentrating or making decisions? 0  Walking or climbing stairs? 0  Dressing or bathing? 0  Doing errands, shopping? 0  Preparing Food and eating ? Y  Using the Toilet? Y  In the past six months, have you accidently leaked urine? Y  Do you have problems with loss of bowel control? N  Managing your Medications? Y  Managing your Finances? Y  Housekeeping or managing your Housekeeping? Y    Physical Exam   See second note discuss chronic conditions for PE, or other factors deemed appropriate based on the beneficiary's medical and social history and current clinical standards.   Advanced Directives: Does Patient Have a Medical Advance Directive?: No Would  patient like information on creating a medical advance directive?: No - Patient declined  EKG:  Declined    Assessment:    This is a routine wellness examination for this patient .   Vision/Hearing screen Hearing Screening  Method: Otoacoustic emissions    Right ear  Left ear  Comments: Hearing within normal limits to whisper  Vision Screening   Right eye Left eye Both eyes  Without correction     With correction 20/20 20/20 20/13      Goals   None    Depression Screen    01/06/2024    2:55 PM 09/28/2022    2:04 PM 12/05/2021    4:00 PM 10/20/2021    9:15 AM  PHQ 2/9 Scores  PHQ - 2 Score 0 0 0 0  PHQ- 9 Score   2 1    Fall Risk    01/06/2024    2:55 PM  Fall Risk   Falls in the past year? 0  Number falls in past yr: 0  Injury with Fall? 0  Follow up Falls evaluation completed    Cognitive Function:        01/06/2024    3:07 PM  6CIT Screen  What Year? 0 points  What month? 0 points  What time? 0 points  Count back from 20 0 points  Months in reverse 0 points  Repeat phrase 0 points  Total Score 0 points    Patient Care Team: Myrlene Broker, MD as PCP - General (Internal Medicine) Durene Romans, MD as Consulting Physician (Orthopedic Surgery)     Plan:    Needs follow up for chronic conditions and labs see separate note.  I have personally reviewed and noted the following in the patient's chart:   Medical and social history Use of alcohol, tobacco or illicit drugs  Current medications and supplements Functional ability and status Nutritional status Physical activity Advanced directives List of other physicians Hospitalizations, surgeries, and ER visits in previous 12 months Vitals Screenings to include cognitive, depression, and falls Referrals and appointments  In addition, I have reviewed and discussed with patient certain preventive protocols, quality metrics, and best practice recommendations. A written personalized care  plan for preventive services as well as general preventive health recommendations were provided to patient.    Myrlene Broker, MD 01/06/2024

## 2024-01-07 ENCOUNTER — Encounter: Payer: Self-pay | Admitting: Internal Medicine

## 2024-01-07 NOTE — Assessment & Plan Note (Signed)
Still seeing dermatology for follow up/skin surveillance and no reported new lesions today. Reminded about sun safety and shade as well as sunscreen.

## 2024-01-07 NOTE — Assessment & Plan Note (Signed)
Taking atorvastatin and this is continued. Checking lipid panel and adjust for goal LDL <100.

## 2024-01-15 ENCOUNTER — Inpatient Hospital Stay: Admission: RE | Admit: 2024-01-15 | Payer: BLUE CROSS/BLUE SHIELD | Source: Ambulatory Visit

## 2024-01-15 ENCOUNTER — Other Ambulatory Visit (HOSPITAL_BASED_OUTPATIENT_CLINIC_OR_DEPARTMENT_OTHER): Payer: BLUE CROSS/BLUE SHIELD | Admitting: Radiology

## 2024-01-16 ENCOUNTER — Ambulatory Visit (INDEPENDENT_AMBULATORY_CARE_PROVIDER_SITE_OTHER)
Admission: RE | Admit: 2024-01-16 | Discharge: 2024-01-16 | Disposition: A | Discharge status: Home / Self Care | Payer: Medicare Other | Source: Ambulatory Visit | Attending: Internal Medicine | Admitting: Internal Medicine

## 2024-01-16 DIAGNOSIS — E2839 Other primary ovarian failure: Secondary | ICD-10-CM | POA: Diagnosis not present

## 2024-01-17 ENCOUNTER — Encounter: Payer: Self-pay | Admitting: Internal Medicine

## 2024-01-17 LAB — HM DEXA SCAN: HM Dexa Scan: -0.4

## 2024-01-29 ENCOUNTER — Ambulatory Visit
Admission: RE | Admit: 2024-01-29 | Discharge: 2024-01-29 | Disposition: A | Discharge status: Home / Self Care | Payer: Medicare Other | Source: Ambulatory Visit | Attending: Internal Medicine | Admitting: Internal Medicine

## 2024-01-29 DIAGNOSIS — Z1231 Encounter for screening mammogram for malignant neoplasm of breast: Secondary | ICD-10-CM

## 2024-01-31 ENCOUNTER — Encounter: Payer: Self-pay | Admitting: Internal Medicine

## 2024-01-31 LAB — HM MAMMOGRAPHY
